# Patient Record
Sex: Female | Born: 1970
Health system: Southern US, Community
[De-identification: ages and names within clinical notes are randomized; demographics above are authoritative.]

## PROBLEM LIST (undated history)

## (undated) DIAGNOSIS — T7840XA Allergy, unspecified, initial encounter: Secondary | ICD-10-CM

## (undated) DIAGNOSIS — R55 Syncope and collapse: Secondary | ICD-10-CM

## (undated) DIAGNOSIS — F32A Depression, unspecified: Secondary | ICD-10-CM

## (undated) DIAGNOSIS — M1990 Unspecified osteoarthritis, unspecified site: Secondary | ICD-10-CM

## (undated) DIAGNOSIS — I10 Essential (primary) hypertension: Secondary | ICD-10-CM

## (undated) DIAGNOSIS — F329 Major depressive disorder, single episode, unspecified: Secondary | ICD-10-CM

## (undated) DIAGNOSIS — D649 Anemia, unspecified: Secondary | ICD-10-CM

## (undated) DIAGNOSIS — K5792 Diverticulitis of intestine, part unspecified, without perforation or abscess without bleeding: Secondary | ICD-10-CM

## (undated) DIAGNOSIS — F419 Anxiety disorder, unspecified: Secondary | ICD-10-CM

## (undated) DIAGNOSIS — K5790 Diverticulosis of intestine, part unspecified, without perforation or abscess without bleeding: Secondary | ICD-10-CM

## (undated) DIAGNOSIS — K219 Gastro-esophageal reflux disease without esophagitis: Secondary | ICD-10-CM

## (undated) DIAGNOSIS — I341 Nonrheumatic mitral (valve) prolapse: Secondary | ICD-10-CM

## (undated) DIAGNOSIS — E079 Disorder of thyroid, unspecified: Secondary | ICD-10-CM

## (undated) HISTORY — DX: Allergy, unspecified, initial encounter: T78.40XA

## (undated) HISTORY — DX: Diverticulitis of intestine, part unspecified, without perforation or abscess without bleeding: K57.92

## (undated) HISTORY — PX: ENDOMETRIAL ABLATION: SHX621

## (undated) HISTORY — PX: TONSILLECTOMY: SHX5217

## (undated) HISTORY — DX: Depression, unspecified: F32.A

## (undated) HISTORY — DX: Anemia, unspecified: D64.9

## (undated) HISTORY — DX: Unspecified osteoarthritis, unspecified site: M19.90

## (undated) HISTORY — DX: Anxiety disorder, unspecified: F41.9

## (undated) HISTORY — DX: Essential (primary) hypertension: I10

## (undated) HISTORY — DX: Major depressive disorder, single episode, unspecified: F32.9

## (undated) HISTORY — DX: Diverticulosis of intestine, part unspecified, without perforation or abscess without bleeding: K57.90

## (undated) HISTORY — DX: Gastro-esophageal reflux disease without esophagitis: K21.9

## (undated) HISTORY — DX: Disorder of thyroid, unspecified: E07.9

---

## 1998-01-01 HISTORY — PX: INDUCED ABORTION: SHX677

## 2000-10-07 ENCOUNTER — Other Ambulatory Visit: Admission: RE | Admit: 2000-10-07 | Discharge: 2000-10-07 | Payer: Self-pay | Admitting: Family Medicine

## 2003-08-03 ENCOUNTER — Encounter: Admission: RE | Admit: 2003-08-03 | Discharge: 2003-08-03 | Payer: Self-pay

## 2003-08-30 ENCOUNTER — Other Ambulatory Visit: Admission: RE | Admit: 2003-08-30 | Discharge: 2003-08-30 | Payer: Self-pay | Admitting: Family Medicine

## 2010-09-20 ENCOUNTER — Other Ambulatory Visit (HOSPITAL_COMMUNITY): Payer: Self-pay | Admitting: Family Medicine

## 2010-09-20 DIAGNOSIS — R1032 Left lower quadrant pain: Secondary | ICD-10-CM

## 2010-09-25 ENCOUNTER — Ambulatory Visit (HOSPITAL_COMMUNITY)
Admission: RE | Admit: 2010-09-25 | Discharge: 2010-09-25 | Disposition: A | Discharge status: Home / Self Care | Payer: Self-pay | Source: Ambulatory Visit | Attending: Family Medicine | Admitting: Family Medicine

## 2010-09-25 DIAGNOSIS — R1032 Left lower quadrant pain: Secondary | ICD-10-CM | POA: Insufficient documentation

## 2012-11-17 ENCOUNTER — Encounter (INDEPENDENT_AMBULATORY_CARE_PROVIDER_SITE_OTHER): Payer: Self-pay

## 2012-11-17 ENCOUNTER — Encounter: Payer: Self-pay | Admitting: General Practice

## 2012-11-17 ENCOUNTER — Ambulatory Visit (INDEPENDENT_AMBULATORY_CARE_PROVIDER_SITE_OTHER): Payer: BC Managed Care – PPO | Admitting: General Practice

## 2012-11-17 VITALS — BP 141/82 | HR 81 | Temp 99.5°F | Ht 62.0 in | Wt 139.5 lb

## 2012-11-17 DIAGNOSIS — Z833 Family history of diabetes mellitus: Secondary | ICD-10-CM

## 2012-11-17 DIAGNOSIS — Z01419 Encounter for gynecological examination (general) (routine) without abnormal findings: Secondary | ICD-10-CM

## 2012-11-17 DIAGNOSIS — Z0000 Encounter for general adult medical examination without abnormal findings: Secondary | ICD-10-CM

## 2012-11-17 LAB — POCT CBC
Granulocyte percent: 64.2 %G (ref 37–80)
Lymph, poc: 2.7 (ref 0.6–3.4)
MCH, POC: 27.7 pg (ref 27–31.2)
MCHC: 32.3 g/dL (ref 31.8–35.4)
MPV: 7.3 fL (ref 0–99.8)
RDW, POC: 12.8 %
WBC: 8.6 10*3/uL (ref 4.6–10.2)

## 2012-11-17 NOTE — Progress Notes (Signed)
  Subjective:    Patient ID: Sandra Powell, female    DOB: [birth year], 42 y.o.   MRN: [id number]  HPI Patient presents today for annual exam with pap. She reports having a history of back pain, due to fracture of buttocks bone. Reports swelling in that area since fracture occurred 16 years ago. Reports being seen by a ortho specialist, when accident occurred. Reports being seen by a chiropractor in [place] for 2 years and treatment was effective, but too expensive. She reports taking OTC medications at times and receives moderate relief.     Review of Systems  Constitutional: Negative for fever and chills.  Respiratory: Negative for chest tightness, shortness of breath and wheezing.   Cardiovascular: Negative for chest pain and palpitations.  Gastrointestinal: Negative for nausea, vomiting, abdominal pain, diarrhea, constipation and blood in stool.  Genitourinary: Negative for difficulty urinating.  Musculoskeletal: Positive for back pain.       Low back pain  Neurological: Negative for dizziness, weakness and headaches.       Objective:   Physical Exam  Constitutional: She is oriented to person, place, and time. She appears well-developed and well-nourished.  HENT:  Head: Normocephalic and atraumatic.  Right Ear: External ear normal.  Left Ear: External ear normal.  Mouth/Throat: Oropharynx is clear and moist.  Eyes: Conjunctivae and EOM are normal. Pupils are equal, round, and reactive to light.  Neck: Normal range of motion. Neck supple. No thyromegaly present.  Cardiovascular: Normal rate, regular rhythm, normal heart sounds and intact distal pulses.   Pulmonary/Chest: Effort normal and breath sounds normal. No respiratory distress. Right breast exhibits no inverted nipple, no mass, no nipple discharge, no skin change and no tenderness. Left breast exhibits no inverted nipple, no mass, no nipple discharge, no skin change and no tenderness. Breasts are symmetrical.  Abdominal: Soft.  Bowel sounds are normal. She exhibits no distension.  Genitourinary: Vagina normal and uterus normal. No breast swelling, tenderness, discharge or bleeding. No labial fusion. There is no rash, tenderness, lesion or injury on the right labia. There is no rash, tenderness, lesion or injury on the left labia. Uterus is not deviated, not enlarged, not fixed and not tender. Cervix exhibits no motion tenderness, no discharge and no friability. Right adnexum displays no mass, no tenderness and no fullness. Left adnexum displays no mass, no tenderness and no fullness. No erythema, tenderness or bleeding around the vagina. No foreign body around the vagina. No signs of injury around the vagina. No vaginal discharge found.  Musculoskeletal: She exhibits edema and tenderness.  Tenderness and edema noted to sacral area, upon palpation  Lymphadenopathy:    She has no cervical adenopathy.  Neurological: She is alert and oriented to person, place, and time.  Skin: Skin is warm and dry.  Psychiatric: She has a normal mood and affect.          Assessment & Plan:  1. Annual physical exam  - Pap IG w/ reflex to HPV when ASC-U - POCT CBC - CMP14+EGFR - NMR, lipoprofile  2. Family history of diabetes mellitus  - POCT glycosylated hemoglobin (Hb A1C) -discussed healthy eating -RTO if symptoms develop or worsen -Patient verbalized understanding -Coralie Keens, FNP-C

## 2012-11-17 NOTE — Patient Instructions (Signed)

## 2012-11-19 LAB — NMR, LIPOPROFILE
HDL Cholesterol by NMR: 40 mg/dL (ref >=40)
HDL Particle Number: 29.2 umol/L — ABNORMAL LOW (ref >=30.5)
LDL Particle Number: 1647 nmol/L — ABNORMAL HIGH (ref <1000)
LDLC SERPL CALC-MCNC: 111 mg/dL — ABNORMAL HIGH (ref <100)
Small LDL Particle Number: 877 nmol/L — ABNORMAL HIGH (ref <=527)
Triglycerides by NMR: 128 mg/dL (ref <150)

## 2012-11-19 LAB — CMP14+EGFR
AST: 13 IU/L (ref 0–40)
Albumin/Globulin Ratio: 1.9 (ref 1.1–2.5)
BUN: 10 mg/dL (ref 6–24)
Glucose: 78 mg/dL (ref 65–99)
Potassium: 4.8 mmol/L (ref 3.5–5.2)
Sodium: 139 mmol/L (ref 134–144)
Total Bilirubin: 0.4 mg/dL (ref 0.0–1.2)

## 2012-11-21 ENCOUNTER — Other Ambulatory Visit: Payer: Self-pay | Admitting: General Practice

## 2012-11-21 LAB — PAP IG W/ RFLX HPV ASCU: PAP Smear Comment: 0

## 2012-11-26 ENCOUNTER — Telehealth: Payer: Self-pay | Admitting: General Practice

## 2012-11-26 NOTE — Telephone Encounter (Signed)
Number not working for patient

## 2012-12-12 NOTE — Telephone Encounter (Signed)
Pt aware, cholesterol elevated,needs to work with diet.

## 2013-04-28 LAB — PULMONARY FUNCTION TEST

## 2013-06-08 ENCOUNTER — Emergency Department (HOSPITAL_COMMUNITY): Payer: BC Managed Care – PPO

## 2013-06-08 ENCOUNTER — Encounter (HOSPITAL_COMMUNITY): Payer: Self-pay | Admitting: Emergency Medicine

## 2013-06-08 ENCOUNTER — Emergency Department (HOSPITAL_COMMUNITY)
Admission: EM | Admit: 2013-06-08 | Discharge: 2013-06-08 | Disposition: A | Discharge status: Home / Self Care | Payer: BC Managed Care – PPO | Attending: Emergency Medicine | Admitting: Emergency Medicine

## 2013-06-08 DIAGNOSIS — R110 Nausea: Secondary | ICD-10-CM | POA: Insufficient documentation

## 2013-06-08 DIAGNOSIS — Z8679 Personal history of other diseases of the circulatory system: Secondary | ICD-10-CM | POA: Insufficient documentation

## 2013-06-08 DIAGNOSIS — R1032 Left lower quadrant pain: Secondary | ICD-10-CM | POA: Insufficient documentation

## 2013-06-08 DIAGNOSIS — R634 Abnormal weight loss: Secondary | ICD-10-CM | POA: Insufficient documentation

## 2013-06-08 DIAGNOSIS — M7981 Nontraumatic hematoma of soft tissue: Secondary | ICD-10-CM | POA: Insufficient documentation

## 2013-06-08 DIAGNOSIS — Z3202 Encounter for pregnancy test, result negative: Secondary | ICD-10-CM | POA: Insufficient documentation

## 2013-06-08 DIAGNOSIS — R109 Unspecified abdominal pain: Secondary | ICD-10-CM

## 2013-06-08 DIAGNOSIS — R55 Syncope and collapse: Secondary | ICD-10-CM | POA: Insufficient documentation

## 2013-06-08 HISTORY — DX: Syncope and collapse: R55

## 2013-06-08 HISTORY — DX: Nonrheumatic mitral (valve) prolapse: I34.1

## 2013-06-08 LAB — POC URINE PREG, ED: PREG TEST UR: NEGATIVE

## 2013-06-08 LAB — COMPREHENSIVE METABOLIC PANEL
ALK PHOS: 47 U/L (ref 39–117)
ALT: 12 U/L (ref 0–35)
AST: 14 U/L (ref 0–37)
Albumin: 3.9 g/dL (ref 3.5–5.2)
BILIRUBIN TOTAL: 0.2 mg/dL — AB (ref 0.3–1.2)
BUN: 10 mg/dL (ref 6–23)
CO2: 27 mEq/L (ref 19–32)
Calcium: 9.1 mg/dL (ref 8.4–10.5)
Chloride: 105 mEq/L (ref 96–112)
Creatinine, Ser: 0.63 mg/dL (ref 0.50–1.10)
Glucose, Bld: 108 mg/dL — ABNORMAL HIGH (ref 70–99)
POTASSIUM: 4.6 meq/L (ref 3.7–5.3)
SODIUM: 142 meq/L (ref 137–147)
TOTAL PROTEIN: 6.7 g/dL (ref 6.0–8.3)

## 2013-06-08 LAB — CBC WITH DIFFERENTIAL/PLATELET
BASOS ABS: 0 10*3/uL (ref 0.0–0.1)
BASOS PCT: 0 % (ref 0–1)
EOS ABS: 0.2 10*3/uL (ref 0.0–0.7)
Eosinophils Relative: 2 % (ref 0–5)
HEMATOCRIT: 34.9 % — AB (ref 36.0–46.0)
Hemoglobin: 11.3 g/dL — ABNORMAL LOW (ref 12.0–15.0)
Lymphocytes Relative: 32 % (ref 12–46)
Lymphs Abs: 2.4 10*3/uL (ref 0.7–4.0)
MCH: 28.4 pg (ref 26.0–34.0)
MCHC: 32.4 g/dL (ref 30.0–36.0)
MCV: 87.7 fL (ref 78.0–100.0)
MONOS PCT: 11 % (ref 3–12)
Monocytes Absolute: 0.8 10*3/uL (ref 0.1–1.0)
NEUTROS PCT: 55 % (ref 43–77)
Neutro Abs: 4.1 10*3/uL (ref 1.7–7.7)
PLATELETS: 246 10*3/uL (ref 150–400)
RBC: 3.98 MIL/uL (ref 3.87–5.11)
RDW: 13.7 % (ref 11.5–15.5)
WBC: 7.5 10*3/uL (ref 4.0–10.5)

## 2013-06-08 LAB — URINALYSIS, ROUTINE W REFLEX MICROSCOPIC
Bilirubin Urine: NEGATIVE
Glucose, UA: NEGATIVE mg/dL
KETONES UR: NEGATIVE mg/dL
Leukocytes, UA: NEGATIVE
Nitrite: NEGATIVE
PROTEIN: NEGATIVE mg/dL
SPECIFIC GRAVITY, URINE: 1.015 (ref 1.005–1.030)
Urobilinogen, UA: 0.2 mg/dL (ref 0.0–1.0)
pH: 7.5 (ref 5.0–8.0)

## 2013-06-08 LAB — LIPASE, BLOOD: LIPASE: 38 U/L (ref 11–59)

## 2013-06-08 LAB — URINE MICROSCOPIC-ADD ON

## 2013-06-08 MED ORDER — IOHEXOL 300 MG/ML  SOLN
50.0000 mL | Freq: Once | INTRAMUSCULAR | Status: AC | PRN
  Administered 2013-06-08: 50 mL via ORAL

## 2013-06-08 MED ORDER — OMEPRAZOLE 20 MG PO CPDR: 20.0000 mg | DELAYED_RELEASE_CAPSULE | Freq: Every day | ORAL | Status: DC

## 2013-06-08 MED ORDER — IOHEXOL 300 MG/ML  SOLN
100.0000 mL | Freq: Once | INTRAMUSCULAR | Status: AC | PRN
  Administered 2013-06-08: 100 mL via INTRAVENOUS

## 2013-06-08 MED ORDER — GI COCKTAIL ~~LOC~~
30.0000 mL | Freq: Once | ORAL | Status: AC
  Administered 2013-06-08: 30 mL via ORAL
  Filled 2013-06-08: qty 30

## 2013-06-08 NOTE — ED Notes (Addendum)
sycopal episode on Saturday, went to Urgent care today and told to come to ER  .  Dx with MVP recently.  Has had syncopal episodes 6 times in past years.  No known cause.  No pain, Nausea without vomiting.    Epigastric discomfort and pain rt scapula.

## 2013-06-08 NOTE — Discharge Instructions (Signed)
As discussed, your evaluation today has been largely reassuring.  But, it is important that you monitor your condition carefully, and do not hesitate to return to the ED if you develop new, or concerning changes in your condition. ° °Otherwise, please follow-up with your physicians for appropriate ongoing care. ° °

## 2013-06-08 NOTE — ED Provider Notes (Addendum)
CSN: [id number]     Arrival date & time 06/08/13  1319 History   First MD Initiated Contact with Patient 06/08/13 1352     Chief Complaint  Patient presents with  . Loss of Consciousness   Patient is a 43 y.o. female presenting with syncope. The history is provided by the patient. No language interpreter was used.  Loss of Consciousness Associated symptoms: nausea   Associated symptoms: no vomiting    This chart was scribed for Sandra Muskrat, MD by Thea Alken, ED Scribe. This patient was seen in room APA02/APA02 and the patient's care was started at 1:53 PM.  HPI Comments:  Sandra Powell is a 43 y.o. female who present to the Emergency Department complaining of LOC x 2 days ago.Pt was seen at urgent care today for intermittent sharp abdominal pain x 2 months that worsens at last night. At this time she reports abdominal discomfort. She states she has nausea when eating. Pt is concerned that the abdominal pain is the is related to LOC. Pt noticed today bruising to posterior head and right lateral thigh. Pt has been diagnosed with mitral valve prolapse.  Pt reports variable menstrual cycles, last tow months ago. She denies prior frank rectal bleeding.   Past Medical History  Diagnosis Date  . MVP (mitral valve prolapse)   . Syncope    Past Surgical History  Procedure Laterality Date  . Tonsillectomy    . Tonsillectomy     History reviewed. No pertinent family history. History  Substance Use Topics  . Smoking status: Never Smoker   . Smokeless tobacco: Not on file  . Alcohol Use: No   OB History   Grav Para Term Preterm Abortions TAB SAB Ect Mult Living                 Review of Systems  Constitutional: Positive for unexpected weight change.  HENT:       Per HPI, otherwise negative  Respiratory:       Per HPI, otherwise negative  Cardiovascular: Positive for syncope.       Per HPI, otherwise negative  Gastrointestinal: Positive for nausea. Negative for vomiting.   Endocrine:       Negative aside from HPI  Genitourinary:       Neg aside from HPI   Musculoskeletal:       Per HPI, otherwise negative  Skin: Negative for pallor.  Neurological: Negative for syncope.    Allergies  Review of patient's allergies indicates no known allergies.  Home Medications   Prior to Admission medications   Not on File   BP 146/88  Pulse 71  Temp(Src) 98.5 F (36.9 C)  Resp 20  Wt 139 lb (63.05 kg)  SpO2 99%  LMP 05/25/2013 Physical Exam  Nursing note and vitals reviewed. Constitutional: She is oriented to person, place, and time. She appears well-developed and well-nourished. No distress.  HENT:  Head: Normocephalic and atraumatic.  Eyes: Conjunctivae and EOM are normal.  Cardiovascular: Normal rate and regular rhythm.   Pulmonary/Chest: Effort normal and breath sounds normal. No stridor. No respiratory distress.  Abdominal: She exhibits no distension. There is tenderness in the left lower quadrant.  Musculoskeletal: She exhibits no edema.  Neurological: She is alert and oriented to person, place, and time. No cranial nerve deficit.  Skin: Skin is warm and dry.  Superficial hematoma of R lateral, mid thigh.  Psychiatric: She has a normal mood and affect.    ED Course  Procedures  Labs Review Labs Reviewed  CBC WITH DIFFERENTIAL - Abnormal; Notable for the following:    Hemoglobin 11.3 (*)    HCT 34.9 (*)    All other components within normal limits  COMPREHENSIVE METABOLIC PANEL - Abnormal; Notable for the following:    Glucose, Bld 108 (*)    Total Bilirubin 0.2 (*)    All other components within normal limits  URINALYSIS, ROUTINE W REFLEX MICROSCOPIC - Abnormal; Notable for the following:    Hgb urine dipstick TRACE (*)    All other components within normal limits  URINE MICROSCOPIC-ADD ON - Abnormal; Notable for the following:    Squamous Epithelial / LPF MANY (*)    All other components within normal limits  LIPASE, BLOOD  POC  URINE PREG, ED     EKG Interpretation   Date/Time:  Monday June 08 2013 13:24:59 EDT Ventricular Rate:  69 PR Interval:  158 QRS Duration: 86 QT Interval:  392 QTC Calculation: 420 R Axis:   86 Text Interpretation:  Normal sinus rhythm Normal ECG Sinus rhythm Normal  ECG Confirmed by Sandra Muskrat  MD ([id number]) on 06/08/2013 2:30:28 PM      After the initial evaluation I reviewed the patient's note from urgent care, including EKG, which is similar to today's study here. Labs suggest anemia, hematuria.  4:07 PM Already exam the patient appears calm.  She is sitting upright, using her cellular telephone, talking with a colleague. We discussed all lab results.  Patient states that she has previously worn a Holter monitor, had echocardiograms in the past 2 months, has a cardiologist in [place] as well as upcoming primary care visit.  She states that she was particularly worried about her gallbladder, as it may"explode". After discussing all results, patient was provided return precautions, follow up instructions explicitly.  MDM    I personally performed the services described in this documentation, which was scribed in my presence. The recorded information has been reviewed and is accurate.  Patient presents with ongoing abdominal pain, none of which is right sided, but there is low suspicion for occult biliary process, or appendicitis. Patient's secondary concern is a syncopal event that occurred 2 days ago.  The patient's evaluation here does not demonstrate dysrhythmia, significantly abnormality. Patient has had prior evaluation, including cardiac monitoring with no firm diagnosis for her syncope. Patient does have a cardiologist and she'll followup in regards to this. Throughout the patient's emergency department course she was hemodynamically stable, neurologically intact, awake, alert, with no evidence of distress. New the etiology for her syncopal event is not clearly  demonstrated, she is appropriate for further evaluation and management as an outpatient  Sandra Muskrat, MD 06/08/13 1612  4:33 PM Just prior to discharge was called back to the patient's room after she had an episode of severe left lower quadrant abdominal pain.  On patient clarifies that she has had several similar episodes in the past days. With his new prescription, patient will have CT scan performed.  6:31 PM No new complaints.  Patient is hemodynamically stable.  CT scan is unremarkable aside from right-sided corpus luteal cyst, which the patient can't discuss with her primary care physician. With no additional complaints, no evidence of decompensation, she was discharged in stable condition.  Sandra Muskrat, MD 06/08/13 [phone]

## 2013-06-08 NOTE — ED Notes (Signed)
Pt alert & oriented x4, stable gait. Patient given discharge instructions, paperwork & prescription(s). Patient  instructed to stop at the registration desk to finish any additional paperwork. Patient verbalized understanding. Pt left department w/ no further questions. 

## 2013-06-12 ENCOUNTER — Other Ambulatory Visit: Payer: Self-pay | Admitting: *Deleted

## 2013-06-12 DIAGNOSIS — R1032 Left lower quadrant pain: Secondary | ICD-10-CM

## 2013-06-12 DIAGNOSIS — R55 Syncope and collapse: Secondary | ICD-10-CM

## 2013-06-12 DIAGNOSIS — R002 Palpitations: Secondary | ICD-10-CM

## 2013-06-12 DIAGNOSIS — I341 Nonrheumatic mitral (valve) prolapse: Secondary | ICD-10-CM

## 2013-06-16 ENCOUNTER — Other Ambulatory Visit: Payer: Self-pay | Admitting: *Deleted

## 2013-06-16 ENCOUNTER — Encounter: Payer: Self-pay | Admitting: Internal Medicine

## 2013-07-22 ENCOUNTER — Ambulatory Visit (INDEPENDENT_AMBULATORY_CARE_PROVIDER_SITE_OTHER): Payer: BC Managed Care – PPO | Admitting: Family

## 2013-07-22 ENCOUNTER — Ambulatory Visit (INDEPENDENT_AMBULATORY_CARE_PROVIDER_SITE_OTHER): Payer: BC Managed Care – PPO

## 2013-07-22 ENCOUNTER — Ambulatory Visit (HOSPITAL_COMMUNITY)
Admission: RE | Admit: 2013-07-22 | Discharge: 2013-07-22 | Disposition: A | Discharge status: Home / Self Care | Payer: BC Managed Care – PPO | Source: Ambulatory Visit | Attending: Family | Admitting: Family

## 2013-07-22 ENCOUNTER — Encounter: Payer: Self-pay | Admitting: Family

## 2013-07-22 VITALS — BP 142/91 | HR 73 | Temp 98.6°F | Ht 62.0 in | Wt 129.4 lb

## 2013-07-22 DIAGNOSIS — M542 Cervicalgia: Secondary | ICD-10-CM

## 2013-07-22 DIAGNOSIS — G44319 Acute post-traumatic headache, not intractable: Secondary | ICD-10-CM

## 2013-07-22 DIAGNOSIS — G44309 Post-traumatic headache, unspecified, not intractable: Secondary | ICD-10-CM | POA: Insufficient documentation

## 2013-07-22 DIAGNOSIS — IMO0001 Reserved for inherently not codable concepts without codable children: Secondary | ICD-10-CM | POA: Insufficient documentation

## 2013-07-22 DIAGNOSIS — M549 Dorsalgia, unspecified: Secondary | ICD-10-CM

## 2013-07-22 DIAGNOSIS — W19XXXS Unspecified fall, sequela: Secondary | ICD-10-CM | POA: Insufficient documentation

## 2013-07-22 DIAGNOSIS — M546 Pain in thoracic spine: Secondary | ICD-10-CM

## 2013-07-22 LAB — POCT CBC
Granulocyte percent: 68.7 %G (ref 37–80)
HCT, POC: 37.7 % (ref 37.7–47.9)
Hemoglobin: 12 g/dL — AB (ref 12.2–16.2)
LYMPH, POC: 2.1 (ref 0.6–3.4)
MCH, POC: 26.7 pg — AB (ref 27–31.2)
MCHC: 31.7 g/dL — AB (ref 31.8–35.4)
MCV: 84.1 fL (ref 80–97)
MPV: 7.2 fL (ref 0–99.8)
POC GRANULOCYTE: 5.1 (ref 2–6.9)
POC LYMPH %: 28.4 % (ref 10–50)
Platelet Count, POC: 281 10*3/uL (ref 142–424)
RBC: 4.5 M/uL (ref 4.04–5.48)
RDW, POC: 13.7 %
WBC: 7.4 10*3/uL (ref 4.6–10.2)

## 2013-07-22 MED ORDER — CYCLOBENZAPRINE HCL 5 MG PO TABS: 5.0000 mg | ORAL_TABLET | Freq: Three times a day (TID) | ORAL | Status: DC | PRN

## 2013-07-22 NOTE — Addendum Note (Signed)
Addended by: Jamelle Haring on: 07/22/2013 10:00 AM   Modules accepted: Orders

## 2013-07-22 NOTE — Patient Instructions (Signed)
Spasticity Spasticity is a condition in which certain muscles contract continuously. This causes stiffness or tightness of the muscles. It may interfere with movement, speech, and manner of walking. CAUSES  This condition is usually caused by damage to the portion of the brain or spinal cord that controls voluntary movement. It may occur in association with: Spinal cord injury. Multiple sclerosis. Cerebral palsy. Brain damage due to lack of oxygen. Brain trauma. Severe head injury. Metabolic diseases such as: Adrenoleukodystrophy. ALS [place] Cerise Gehrig's disease). Phenylketonuria. SYMPTOMS  Increased muscle tone (hypertonicity). A series of rapid muscle contractions (clonus). Exaggerated deep tendon reflexes. Muscle spasms. Involuntary crossing of the legs (scissoring). Fixed joints. The degree of spasticity varies. It ranges from mild muscle stiffness to severe, painful, and uncontrollable muscle spasms. It can interfere with rehabilitation in patients with certain disorders. It often interferes with daily activities. TREATMENT  Treatment may include: Medications. Physical therapy regimens. They may include muscle stretching and range of motion exercises. These help prevent shrinkage or shortening of muscles. They also help reduce the severity of symptoms. Surgery. This may be recommended for tendon release or to sever the nerve-muscle pathway. PROGNOSIS  The outcome for those with spasticity depends on: Severity of the spasticity. Associated disorder(s). Document Released: 12/08/2001 Document Revised: 03/12/2011 Document Reviewed: 12/18/2004 [facility] Patient Information 2015 [place], [place]. This information is not intended to replace advice given to you by your health care provider. Make sure you discuss any questions you have with your health care provider. Muscle Cramps and Spasms Muscle cramps and spasms occur when a muscle or muscles tighten and you have no control over this  tightening (involuntary muscle contraction). They are a common problem and can develop in any muscle. The most common place is in the calf muscles of the leg. Both muscle cramps and muscle spasms are involuntary muscle contractions, but they also have differences:   Muscle cramps are sporadic and painful. They may last a few seconds to a quarter of an hour. Muscle cramps are often more forceful and last longer than muscle spasms.  Muscle spasms may or may not be painful. They may also last just a few seconds or much longer. CAUSES  It is uncommon for cramps or spasms to be due to a serious underlying problem. In many cases, the cause of cramps or spasms is unknown. Some common causes are:   Overexertion.   Overuse from repetitive motions (doing the same thing over and over).   Remaining in a certain position for a long period of time.   Improper preparation, form, or technique while performing a sport or activity.   Dehydration.   Injury.   Side effects of some medicines.   Abnormally low levels of the salts and ions in your blood (electrolytes), especially potassium and calcium. This could happen if you are taking water pills (diuretics) or you are pregnant.  Some underlying medical problems can make it more likely to develop cramps or spasms. These include, but are not limited to:   Diabetes.   Parkinson disease.   Hormone disorders, such as thyroid problems.   Alcohol abuse.   Diseases specific to muscles, joints, and bones.   Blood vessel disease where not enough blood is getting to the muscles.  HOME CARE INSTRUCTIONS   Stay well hydrated. Drink enough water and fluids to keep your urine clear or pale yellow.  It may be helpful to massage, stretch, and relax the affected muscle.  For tight or tense muscles, use a warm  towel, heating pad, or hot shower water directed to the affected area.  If you are sore or have pain after a cramp or spasm, applying ice  to the affected area may relieve discomfort.  Put ice in a plastic bag.  Place a towel between your skin and the bag.  Leave the ice on for 15-20 minutes, 03-04 times a day.  Medicines used to treat a known cause of cramps or spasms may help reduce their frequency or severity. Only take over-the-counter or prescription medicines as directed by your caregiver. SEEK MEDICAL CARE IF:  Your cramps or spasms get more severe, more frequent, or do not improve over time.  MAKE SURE YOU:   Understand these instructions.  Will watch your condition.  Will get help right away if you are not doing well or get worse. Document Released: 06/09/2001 Document Revised: 04/14/2012 Document Reviewed: 12/05/2011 [facility] Patient Information 2015 [place], [place]. This information is not intended to replace advice given to you by your health care provider. Make sure you discuss any questions you have with your health care provider. RICE: Routine Care for Injuries The routine care of many injuries includes Rest, Ice, Compression, and Elevation (RICE). HOME CARE INSTRUCTIONS  Rest is needed to allow your body to heal. Routine activities can usually be resumed when comfortable. Injured tendons and bones can take up to 6 weeks to heal. Tendons are the cord-like structures that attach muscle to bone.  Ice following an injury helps keep the swelling down and reduces pain.  Put ice in a plastic bag.  Place a towel between your skin and the bag.  Leave the ice on for 15-20 minutes, 3-4 times a day, or as directed by your health care provider. Do this while awake, for the first 24 to 48 hours. After that, continue as directed by your caregiver.  Compression helps keep swelling down. It also gives support and helps with discomfort. If an elastic bandage has been applied, it should be removed and reapplied every 3 to 4 hours. It should not be applied tightly, but firmly enough to keep swelling down. Watch fingers or  toes for swelling, bluish discoloration, coldness, numbness, or excessive pain. If any of these problems occur, remove the bandage and reapply loosely. Contact your caregiver if these problems continue.  Elevation helps reduce swelling and decreases pain. With extremities, such as the arms, hands, legs, and feet, the injured area should be placed near or above the level of the heart, if possible. SEEK IMMEDIATE MEDICAL CARE IF:  You have persistent pain and swelling.  You develop redness, numbness, or unexpected weakness.  Your symptoms are getting worse rather than improving after several days. These symptoms may indicate that further evaluation or further X-rays are needed. Sometimes, X-rays may not show a small broken bone (fracture) until 1 week or 10 days later. Make a follow-up appointment with your caregiver. Ask when your X-ray results will be ready. Make sure you get your X-ray results. Document Released: 04/01/2000 Document Revised: 12/23/2012 Document Reviewed: 05/19/2010 [facility] Patient Information 2015 [place], [place]. This information is not intended to replace advice given to you by your health care provider. Make sure you discuss any questions you have with your health care provider.

## 2013-07-22 NOTE — Progress Notes (Signed)
Subjective:    Patient ID: Sandra Powell, female    DOB: 1970/07/29, 43 y.o.   MRN: [id number]  Back Pain This is a new problem. The current episode started 1 to 4 weeks ago (July 4). The problem occurs intermittently. The problem has been gradually worsening since onset. The pain is present in the lumbar spine, thoracic spine and gluteal. The quality of the pain is described as aching, shooting and stabbing. Radiates to: bilateral arms, and hips. The pain is at a severity of 8/10. The pain is moderate. The pain is the same all the time. The symptoms are aggravated by twisting and position. Associated symptoms include headaches, leg pain and tingling. Pertinent negatives include no abdominal pain, bladder incontinence, bowel incontinence, chest pain or numbness. Risk factors include recent trauma. She has tried NSAIDs and ice for the symptoms. The treatment provided mild relief.   *Pt was hanging upside down and fell on head on concrete on July 4. Pt states her sysptoms are getting worse. States she is having a constant headache that moves from left side to right side to frontal lobe.      Review of Systems  Constitutional: Negative.   Eyes: Negative.   Respiratory: Negative.  Negative for shortness of breath.   Cardiovascular: Negative.  Negative for chest pain and palpitations.  Gastrointestinal: Negative.  Negative for abdominal pain and bowel incontinence.  Endocrine: Negative.   Genitourinary: Negative.  Negative for bladder incontinence.  Musculoskeletal: Positive for back pain.  Neurological: Positive for tingling and headaches. Negative for numbness.  Hematological: Negative.   Psychiatric/Behavioral: Negative.   All other systems reviewed and are negative.      Objective:   Physical Exam  Vitals reviewed. Constitutional: She is oriented to person, place, and time. She appears well-developed and well-nourished. No distress.  HENT:  Head: Normocephalic and atraumatic.  Right  Ear: External ear normal.  Left Ear: External ear normal.  Mouth/Throat: Oropharynx is clear and moist.  Eyes: Pupils are equal, round, and reactive to light.  Neck: Normal range of motion. Neck supple. No thyromegaly present.  Cardiovascular: Normal rate, regular rhythm, normal heart sounds and intact distal pulses.   No murmur heard. Pulmonary/Chest: Effort normal and breath sounds normal. No respiratory distress. She has no wheezes.  Abdominal: Soft. Bowel sounds are normal. She exhibits no distension. There is no tenderness.  Musculoskeletal: She exhibits edema and tenderness.  Decreased ROM of neck and lower back r/t to pain  Neurological: She is alert and oriented to person, place, and time. She has normal reflexes. No cranial nerve deficit.  Skin: Skin is warm and dry.  Psychiatric: She has a normal mood and affect. Her behavior is normal. Judgment and thought content normal.    BP 142/91  Pulse 73  Temp(Src) 98.6 F (37 C) (Oral)  Ht 5' 2"  (1.575 m)  Wt 129 lb 6.4 oz (58.695 kg)  BMI 23.66 kg/m2  LMP 07/12/2013       Assessment & Plan:  1. Neck pain - DG Thoracic Spine 2 View; Future - DG Lumbar Spine 2-3 Views; Future - DG Cervical Spine Complete; Future - cyclobenzaprine (FLEXERIL) 5 MG tablet; Take 1 tablet (5 mg total) by mouth 3 (three) times daily as needed for muscle spasms.  Dispense: 60 tablet; Refill: 1 - POCT CBC - CMP14+EGFR  2. Midline thoracic back pain - cyclobenzaprine (FLEXERIL) 5 MG tablet; Take 1 tablet (5 mg total) by mouth 3 (three) times daily as needed for  muscle spasms.  Dispense: 60 tablet; Refill: 1 - POCT CBC - CMP14+EGFR  3. Acute post-traumatic headache, not intractable - CT Head W Contrast; Future STAT - POCT CBC - CMP14+EGFR  RTO in 2 weeks No NSAIDS until Ct scan comes back Discussed flexeril may cause drowsiness  Evelina Dun, FNP

## 2013-07-23 LAB — CMP14+EGFR
ALT: 15 IU/L (ref 0–32)
AST: 15 IU/L (ref 0–40)
Albumin/Globulin Ratio: 1.8 (ref 1.1–2.5)
Albumin: 4.7 g/dL (ref 3.5–5.5)
Alkaline Phosphatase: 62 IU/L (ref 39–117)
BILIRUBIN TOTAL: 0.3 mg/dL (ref 0.0–1.2)
BUN/Creatinine Ratio: 15 (ref 9–23)
BUN: 14 mg/dL (ref 6–24)
CO2: 26 mmol/L (ref 18–29)
Calcium: 9.7 mg/dL (ref 8.7–10.2)
Chloride: 101 mmol/L (ref 97–108)
Creatinine, Ser: 0.92 mg/dL (ref 0.57–1.00)
GFR, EST AFRICAN AMERICAN: 88 mL/min/{1.73_m2} (ref >59)
GFR, EST NON AFRICAN AMERICAN: 77 mL/min/{1.73_m2} (ref >59)
GLUCOSE: 80 mg/dL (ref 65–99)
Globulin, Total: 2.6 g/dL (ref 1.5–4.5)
POTASSIUM: 4.3 mmol/L (ref 3.5–5.2)
SODIUM: 141 mmol/L (ref 134–144)
TOTAL PROTEIN: 7.3 g/dL (ref 6.0–8.5)

## 2013-07-27 ENCOUNTER — Telehealth: Payer: Self-pay | Admitting: Family Medicine

## 2013-07-27 NOTE — Telephone Encounter (Signed)
Message copied by Waverly Ferrari on Mon Jul 27, 2013  9:21 AM ------      Message from: Lenna Gilford, [place] A      Created: Mon Jul 27, 2013  9:09 AM       CBC (WBC, HGB, and Plts)-WNL      Kidney and liver function stable       ------

## 2013-08-18 ENCOUNTER — Encounter: Payer: Self-pay | Admitting: *Deleted

## 2013-08-19 ENCOUNTER — Ambulatory Visit (INDEPENDENT_AMBULATORY_CARE_PROVIDER_SITE_OTHER): Payer: BC Managed Care – PPO | Admitting: Cardiology

## 2013-08-19 ENCOUNTER — Encounter: Payer: Self-pay | Admitting: Cardiology

## 2013-08-19 VITALS — BP 133/82 | HR 76 | Ht 62.0 in | Wt 125.0 lb

## 2013-08-19 DIAGNOSIS — I059 Rheumatic mitral valve disease, unspecified: Secondary | ICD-10-CM

## 2013-08-19 DIAGNOSIS — I341 Nonrheumatic mitral (valve) prolapse: Secondary | ICD-10-CM

## 2013-08-19 MED ORDER — ATENOLOL 50 MG PO TABS: 50.0000 mg | ORAL_TABLET | Freq: Every day | ORAL | Status: DC

## 2013-08-19 NOTE — Patient Instructions (Signed)
The current medical regimen is effective;  continue present plan and medications.  Follow up in 1 year with Dr. Hochrein in [place].  You will receive a letter in the mail 2 months before you are due.  Please call us when you receive this letter to schedule your follow up appointment.  

## 2013-08-19 NOTE — Progress Notes (Signed)
HPI The patient presents as a new patient for follow up of MVP. She was recently evaluated by a cardiologist in [place] for evaluation of chest discomfort. She had been having palpitations and with this some discomfort and fluttering in her chest. She has a history of syncope about 8 times over the years the last episode being about 2 months ago.  I don't have any of the outside records. However, she describes having a monitor for one week which apparently demonstrated some palpitations and she reports having a treadmill test, PFTs and echocardiogram. She was subsequently started on atenolol. She says that since that time she's felt much better. She's not having any palpitations. She's had no recurrent syncope or presyncope. She's had no further chest discomfort, neck or arm discomfort. She says that stress and anxiety seem to be less problematic as well.  No Known Allergies  Current Outpatient Prescriptions  Medication Sig Dispense Refill  . atenolol (TENORMIN) 50 MG tablet Take 50 mg by mouth daily.      . cyclobenzaprine (FLEXERIL) 5 MG tablet Take 1 tablet (5 mg total) by mouth 3 (three) times daily as needed for muscle spasms.  60 tablet  1  . naproxen sodium (ANAPROX) 220 MG tablet Take 440 mg by mouth daily as needed (back pain.).       No current facility-administered medications for this visit.    Past Medical History  Diagnosis Date  . MVP (mitral valve prolapse)   . Syncope   . Diverticulosis     Past Surgical History  Procedure Laterality Date  . Tonsillectomy    . Tonsillectomy      No family history on file.  History   Social History  . Marital Status: Single    Spouse Name: N/A    Number of Children: N/A  . Years of Education: N/A   Occupational History  . Not on file.   Social History Main Topics  . Smoking status: Never Smoker   . Smokeless tobacco: Not on file  . Alcohol Use: No  . Drug Use: Yes    Special: Marijuana  . Sexual Activity: Yes   Birth Control/ Protection: None   Other Topics Concern  . Not on file   Social History Narrative  . No narrative on file    ROS: Positive for pains, muscle cramps, headaches, dizziness, nausea, vomiting, constipation. Otherwise as stated in the HPI and negative for all other systems.  PHYSICAL EXAM BP 133/82  Pulse 76  Ht 5\' 3"  (1.6 m)  Wt 125 lb (56.7 kg)  BMI 22.15 kg/m2  LMP 07/09/2013 GENERAL:  Well appearing HEENT:  Pupils equal round and reactive, fundi not visualized, oral mucosa unremarkable NECK:  No jugular venous distention, waveform within normal limits, carotid upstroke brisk and symmetric, no bruits, no thyromegaly LYMPHATICS:  No cervical, inguinal adenopathy LUNGS:  Clear to auscultation bilaterally BACK:  No CVA tenderness CHEST:  Unremarkable HEART:  PMI not displaced or sustained,S1 and S2 within normal limits, no S3, no S4, no clicks, no rubs, no murmurs ABD:  Flat, positive bowel sounds normal in frequency in pitch, no bruits, no rebound, no guarding, no midline pulsatile mass, no hepatomegaly, no splenomegaly EXT:  2 plus pulses throughout, no edema, no cyanosis no clubbing SKIN:  No rashes no nodules NEURO:  Cranial nerves II through XII grossly intact, motor grossly intact throughout PSYCH:  Cognitively intact, oriented to person place and time   EKG:  Sinus rhythm, rate 69,  axis within normal limits, intervals within normal limits, no acute ST-T wave changes  06/08/13  ASSESSMENT AND PLAN  SYNCOPE:  She has had no further episodes of this. The etiology is not clear. At this point I would like to review the outside records but would not suggest a change in therapy and if she has recurrent events.  PALPITATIONS:  He seemed to be controlled with a beta blocker. We'll be happy to refill this prescription.  MVP:  I don't hear any regurgitation. I will ollow this clinically and I will look at the echo result as above.

## 2013-08-20 ENCOUNTER — Ambulatory Visit (INDEPENDENT_AMBULATORY_CARE_PROVIDER_SITE_OTHER): Payer: BC Managed Care – PPO | Admitting: Internal Medicine

## 2013-08-20 ENCOUNTER — Encounter: Payer: Self-pay | Admitting: Internal Medicine

## 2013-08-20 VITALS — BP 122/70 | HR 76 | Ht 62.25 in | Wt 126.3 lb

## 2013-08-20 DIAGNOSIS — K5900 Constipation, unspecified: Secondary | ICD-10-CM

## 2013-08-20 DIAGNOSIS — R634 Abnormal weight loss: Secondary | ICD-10-CM

## 2013-08-20 DIAGNOSIS — R1084 Generalized abdominal pain: Secondary | ICD-10-CM

## 2013-08-20 DIAGNOSIS — R1013 Epigastric pain: Secondary | ICD-10-CM

## 2013-08-20 MED ORDER — NA SULFATE-K SULFATE-MG SULF 17.5-3.13-1.6 GM/177ML PO SOLN: 1.0000 | Freq: Once | ORAL | Status: DC

## 2013-08-20 NOTE — Progress Notes (Signed)
HISTORY OF PRESENT ILLNESS:  Sandra Powell is a 43 y.o. 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 She sent today, by Dr. Laurance Flatten, regarding chronic abdominal complaints. No previous formal evaluation for the same. She tells me that she has had a 3 year history of constipation associated with abdominal discomfort and bloating. Historically, she reports having had regular normal bowel movements. She has tried fiber without relief. She does notice his symptoms are improved after defecation. She also reports to me 20 pound weight loss over the past 3 months. No change in diet except for limitation of soda. In addition she reports epigastric discomfort, occasional heartburn, and nausea. No vomiting, melena, or hematochezia. No family history of inflammatory bowel disease or gastrointestinal malignancy. The patient was seen in the emergency room in June 4 syncope. Evaluation of outside laboratories and imaging are as follows. Abdominal ultrasound 09/25/2010 was normal. CT scan of the abdomen and pelvis with contrast 06/08/2013 revealed colonic diverticulosis but was otherwise unremarkable. Laboratories in June and July showed a normal comprehensive metabolic panel and CBC (hemoglobin 12.0). Urine pregnancy test was negative. Patient has had no abdominal or pelvic surgery.  REVIEW OF SYSTEMS:  All non-GI ROS negative except for sinus and allergy trouble, back pain, blood in urine, visual change, depression, fatigue, heart murmur, menstrual pain, muscle cramps, night sweats, shortness of breath, excessive thirst, excessive urination  Past Medical History  Diagnosis Date  . MVP (mitral valve prolapse)   . Syncope   . Diverticulosis   . Anemia   . Anxiety   . Depression     Past Surgical History  Procedure Laterality Date  . Tonsillectomy    . Induced abortion  2000    Social History Sandra Powell  reports that she has never smoked. She has never used  smokeless tobacco. She reports that she drinks alcohol. She reports that she uses illicit drugs (Marijuana).  family history includes Heart disease in her sister; Heart failure (age of onset: 60) in an other family member; Heart failure (age of onset: 82) in her sister; Mitral valve prolapse in her sister; Syncope episode in her father and sister; Uterine cancer in her mother.  No Known Allergies     PHYSICAL EXAMINATION: Vital signs: BP 122/70  Pulse 76  Ht 5' 2.25" (1.581 m)  Wt 126 lb 5 oz (57.295 kg)  BMI 22.92 kg/m2  LMP 08/10/2013  Constitutional: generally well-appearing, no acute distress Psychiatric: alert and oriented x3, cooperative Eyes: extraocular movements intact, anicteric, conjunctiva pink Mouth: oral pharynx moist, no lesions Neck: supple no lymphadenopathy Cardiovascular: heart regular rate and rhythm, no murmur Lungs: clear to auscultation bilaterally Abdomen: soft, nontender, nondistended, no obvious ascites, no peritoneal signs, normal bowel sounds, no organomegaly Rectal: Deferred until colonoscopy Extremities: no lower extremity edema bilaterally Skin: no lesions on visible extremities Neuro: No focal deficits. Normal reflexes.    ASSESSMENT:  #1. Chronic worsening abdominal discomfort and constipation. Suspect constipation predominant IBS #2. 20 pound weight loss over the past 3 months. Etiology unclear #3. Nausea #4. Epigastric discomfort   PLAN:  #1. Colonoscopy and upper endoscopy.The nature of the procedure, as well as the risks, benefits, and alternatives were carefully and thoroughly reviewed with the patient. Ample time for discussion and questions allowed. The patient understood, was satisfied, and agreed to proceed. Suprep prescribed. #2. If examinations are negative, consider Linzess and PPI with subsequent office followup

## 2013-08-20 NOTE — Patient Instructions (Signed)

## 2013-08-26 ENCOUNTER — Encounter: Payer: Self-pay | Admitting: Internal Medicine

## 2013-08-28 ENCOUNTER — Telehealth: Payer: Self-pay | Admitting: Cardiology

## 2013-08-28 NOTE — Telephone Encounter (Signed)
ROI faxed to [facility] [phone]/km

## 2013-09-02 ENCOUNTER — Ambulatory Visit (AMBULATORY_SURGERY_CENTER): Payer: BC Managed Care – PPO | Admitting: Internal Medicine

## 2013-09-02 ENCOUNTER — Encounter: Payer: Self-pay | Admitting: Internal Medicine

## 2013-09-02 ENCOUNTER — Other Ambulatory Visit: Payer: Self-pay

## 2013-09-02 VITALS — BP 130/68 | HR 71 | Temp 98.4°F | Resp 22 | Ht 62.25 in | Wt 126.0 lb

## 2013-09-02 DIAGNOSIS — K5730 Diverticulosis of large intestine without perforation or abscess without bleeding: Secondary | ICD-10-CM

## 2013-09-02 DIAGNOSIS — K2100 Gastro-esophageal reflux disease with esophagitis, without bleeding: Secondary | ICD-10-CM

## 2013-09-02 DIAGNOSIS — K5900 Constipation, unspecified: Secondary | ICD-10-CM

## 2013-09-02 DIAGNOSIS — R1013 Epigastric pain: Secondary | ICD-10-CM

## 2013-09-02 DIAGNOSIS — R634 Abnormal weight loss: Secondary | ICD-10-CM

## 2013-09-02 MED ORDER — LINACLOTIDE 145 MCG PO CAPS: 145.0000 ug | ORAL_CAPSULE | Freq: Every day | ORAL | Status: DC

## 2013-09-02 MED ORDER — OMEPRAZOLE 20 MG PO CPDR: 20.0000 mg | DELAYED_RELEASE_CAPSULE | Freq: Every day | ORAL | Status: DC

## 2013-09-02 MED ORDER — SODIUM CHLORIDE 0.9 % IV SOLN: 500.0000 mL | INTRAVENOUS | Status: DC

## 2013-09-02 NOTE — Op Note (Signed)
[place]  [address] [place], [address]   COLONOSCOPY PROCEDURE REPORT  PATIENT: Sandra Powell, Sandra Powell  MR#: [id number] BIRTHDATE: [birth year] , 64  yrs. old GENDER: Female ENDOSCOPIST: Eustace Quail, MD REFERRED FS:ELTRVU Laurance Flatten, M.D. PROCEDURE DATE:  09/02/2013 PROCEDURE:   Colonoscopy, diagnostic First Screening Colonoscopy - Avg.  risk and is 50 yrs.  old or older - No.  Prior Negative Screening - Now for repeat screening. N/A  History of Adenoma - Now for follow-up colonoscopy & has been > or = to 3 yrs.  N/A  Polyps Removed Today? No.  Recommend repeat exam, <10 yrs? No. ASA CLASS:   Class II INDICATIONS:Abdominal pain, Change in bowel habits, and Weight loss.  MEDICATIONS: MAC sedation, administered by CRNA and propofol (Diprivan) 350mg  IV DESCRIPTION OF PROCEDURE:   After the risks benefits and alternatives of the procedure were thoroughly explained, informed consent was obtained.  A digital rectal exam revealed no abnormalities of the rectum.   The LB [id number]  endoscope was introduced through the anus and advanced to the cecum, which was identified by both the appendix and ileocecal valve. No adverse events experienced.   The quality of the prep was excellent, using MoviPrep  The instrument was then slowly withdrawn as the colon was fully examined.  COLON FINDINGS: Moderate diverticulosis was noted in the left colon. The colon was otherwise normal.  There was no  inflammation, polyps or cancers unless previously stated.  Retroflexed views revealed internal hemorrhoids. The time to cecum=3 minutes 17 seconds.  Withdrawal time=7 minutes 44 seconds.  The scope was withdrawn and the procedure completed. COMPLICATIONS: There were no complications.  ENDOSCOPIC IMPRESSION: 1.   Moderate diverticulosis was noted in the left colon 2.   The colon was otherwise normal  RECOMMENDATIONS: 1.  Continue current colorectal screening recommendations  for "routine risk" patients with a repeat colonoscopy in 10 years. 2.  Upper endoscopy today (see report) 3. Linzess 145 mg daily (for bowels and discomfort)... 30 day voucher provided 4. Office follow up with Dr Henrene Pastor in 4-6 weeks   eSigned:  Eustace Quail, MD 09/02/2013 10:41 AM   cc: Redge Gainer, MD and The Patient

## 2013-09-02 NOTE — Op Note (Signed)
[place]  [address]. [place], [address]   ENDOSCOPY PROCEDURE REPORT  PATIENT: Sandra Powell, Sandra Powell  MR#: [id number] BIRTHDATE: 10/16/1970 , 43  yrs. old GENDER: Female ENDOSCOPIST: Eustace Quail, MD REFERRED BY:  Redge Gainer, M.D. PROCEDURE DATE:  09/02/2013 PROCEDURE:  EGD, diagnostic ASA CLASS:     Class II INDICATIONS:  abdominal pain.   Weight loss. MEDICATIONS: MAC sedation, administered by CRNA and propofol (Diprivan) 50mg  IV TOPICAL ANESTHETIC: none  DESCRIPTION OF PROCEDURE: After the risks benefits and alternatives of the procedure were thoroughly explained, informed consent was obtained.  The LB [id number] endoscope was introduced through the mouth and advanced to the second portion of the duodenum. Without limitations.  The instrument was slowly withdrawn as the mucosa was fully examined.      Mild distal esophagitis.  Normal stomach and duodenum.  Retroflexed views revealed a hiatal hernia.     The scope was then withdrawn from the patient and the procedure completed.  COMPLICATIONS: There were no complications. ENDOSCOPIC IMPRESSION: 1. GERD with Mild distal esophagitis. 2. Otherwise normal EGD  RECOMMENDATIONS: 1.  Anti-reflux regimen to be followed 2.  Prescribe omeprazole 20 mg daily; #30; 11 refills 3.  Call for office visit to be seen in 4-6 weeks  REPEAT EXAM:  eSigned:  Eustace Quail, MD 09/02/2013 10:49 AM   GU:[id number] Laurance Flatten, MD and The Patient

## 2013-09-02 NOTE — Patient Instructions (Signed)
YOU HAD AN ENDOSCOPIC PROCEDURE TODAY AT THE [facility]: Refer to the procedure report that was given to you for any specific questions about what was found during the examination.  If the procedure report does not answer your questions, please call your gastroenterologist to clarify.  If you requested that your care partner not be given the details of your procedure findings, then the procedure report has been included in a sealed envelope for you to review at your convenience later.  YOU SHOULD EXPECT: Some feelings of bloating in the abdomen. Passage of more gas than usual.  Walking can help get rid of the air that was put into your GI tract during the procedure and reduce the bloating. If you had a lower endoscopy (such as a colonoscopy or flexible sigmoidoscopy) you may notice spotting of blood in your stool or on the toilet paper. If you underwent a bowel prep for your procedure, then you may not have a normal bowel movement for a few days.  DIET: Your first meal following the procedure should be a light meal and then it is ok to progress to your normal diet.  A half-sandwich or bowl of soup is an example of a good first meal.  Heavy or fried foods are harder to digest and may make you feel nauseous or bloated.  Likewise meals heavy in dairy and vegetables can cause extra gas to form and this can also increase the bloating.  Drink plenty of fluids but you should avoid alcoholic beverages for 24 hours.  ACTIVITY: Your care partner should take you home directly after the procedure.  You should plan to take it easy, moving slowly for the rest of the day.  You can resume normal activity the day after the procedure however you should NOT DRIVE or use heavy machinery for 24 hours (because of the sedation medicines used during the test).    SYMPTOMS TO REPORT IMMEDIATELY: A gastroenterologist can be reached at any hour.  During normal business hours, 8:30 AM to 5:00 PM Monday through Friday,  call [phone].  After hours and on weekends, please call the GI answering service at [phone] who will take a message and have the physician on call contact you.   Following lower endoscopy (colonoscopy or flexible sigmoidoscopy):  Excessive amounts of blood in the stool  Significant tenderness or worsening of abdominal pains  Swelling of the abdomen that is new, acute  Fever of 100F or higher  Following upper endoscopy (EGD)  Vomiting of blood or coffee ground material  New chest pain or pain under the shoulder blades  Painful or persistently difficult swallowing  New shortness of breath  Fever of 100F or higher  Black, tarry-looking stools  FOLLOW UP: If any biopsies were taken you will be contacted by phone or by letter within the next 1-3 weeks.  Call your gastroenterologist if you have not heard about the biopsies in 3 weeks.  Our staff will call the home number listed on your records the next business day following your procedure to check on you and address any questions or concerns that you may have at that time regarding the information given to you following your procedure. This is a courtesy call and so if there is no answer at the home number and we have not heard from you through the emergency physician on call, we will assume that you have returned to your regular daily activities without incident.  SIGNATURES/CONFIDENTIALITY: You and/or your care   partner have signed paperwork which will be entered into your electronic medical record.  These signatures attest to the fact that that the information above on your After Visit Summary has been reviewed and is understood.  Full responsibility of the confidentiality of this discharge information lies with you and/or your care-partner.    Handouts were given to your care partner on diverticulosis, a high fiber diet with liberal fluid intake, and GERD. You may resume your current medications today.  Dr. Henrene Pastor gave you a  voucher for Linzess.  A rx was sent to [facility],  for omeprazole 20 mg. Please call if any questions or concerns. You need to see Dr. Henrene Pastor for a follow up appointment in 4 - 6 weeks.  Call to schedule appointment within a couple of days.

## 2013-09-02 NOTE — Progress Notes (Signed)
No problems noted in the recovery room. maw 

## 2013-09-02 NOTE — Progress Notes (Signed)
A/ox3, pleased with MAC, report to RN 

## 2013-09-03 ENCOUNTER — Telehealth: Payer: Self-pay

## 2013-09-03 NOTE — Telephone Encounter (Signed)
Left a message at [phone] for the pt to call us back if any questions or concerns. maw

## 2013-09-14 ENCOUNTER — Telehealth: Payer: Self-pay | Admitting: Cardiology

## 2013-09-14 NOTE — Telephone Encounter (Signed)
Records rec Via Fax From [place], [place] Interoffice to [place] 9.14.15/km

## 2013-10-16 ENCOUNTER — Other Ambulatory Visit: Payer: Self-pay

## 2013-10-26 ENCOUNTER — Encounter: Payer: Self-pay | Admitting: Internal Medicine

## 2013-10-26 ENCOUNTER — Ambulatory Visit (INDEPENDENT_AMBULATORY_CARE_PROVIDER_SITE_OTHER): Payer: BC Managed Care – PPO | Admitting: Internal Medicine

## 2013-10-26 VITALS — BP 114/72 | HR 68 | Ht 65.25 in | Wt 131.4 lb

## 2013-10-26 DIAGNOSIS — K589 Irritable bowel syndrome without diarrhea: Secondary | ICD-10-CM

## 2013-10-26 DIAGNOSIS — K219 Gastro-esophageal reflux disease without esophagitis: Secondary | ICD-10-CM

## 2013-10-26 NOTE — Patient Instructions (Signed)
Please follow up with Dr. Perry in one year 

## 2013-10-26 NOTE — Progress Notes (Signed)
HISTORY OF PRESENT ILLNESS:  Sandra Powell is a 43 y.o. female who was evaluated in the office 08/20/2013 regarding chronic worsening abdominal discomfort, constipation, nausea with epigastric discomfort, and weight loss. See that dictation. She subsequently underwent colonoscopy and upper endoscopy on 09/02/2013. Colonoscopy revealed moderate left-sided diverticulosis but was otherwise normal. Upper endoscopy revealed mild esophagitis but was otherwise normal. Previous CT scan was negative. She was treated with omeprazole and Linzess 145 g daily. she presents today for follow-up. Her nausea and epigastric discomfort have resolved. She is now experiencing bowel movements after each meal. These are loose. Her abdomen feels much better. She has gained 5 pounds. She is pleased. No new complaints aside from frequent postprandial loose stools  REVIEW OF SYSTEMS:  All non-GI ROS negative except for anxiety  Past Medical History  Diagnosis Date  . MVP (mitral valve prolapse)   . Syncope   . Diverticulosis   . Anemia   . Anxiety   . Depression   . GERD (gastroesophageal reflux disease)     Past Surgical History  Procedure Laterality Date  . Tonsillectomy    . Induced abortion  2000    Social History Makala Fetterolf  reports that she has never smoked. She has never used smokeless tobacco. She reports that she drinks alcohol. She reports that she uses illicit drugs (Marijuana).  family history includes Heart disease in her sister; Heart failure (age of onset: 28) in an other family member; Heart failure (age of onset: 54) in her sister; Mitral valve prolapse in her sister; Syncope episode in her father and sister; Uterine cancer in her mother. There is no history of Colon cancer, Esophageal cancer, Rectal cancer, or Stomach cancer.  No Known Allergies     PHYSICAL EXAMINATION: Vital signs: BP 114/72  Pulse 68  Ht 5' 5.25" (1.657 m)  Wt 131 lb 6.4 oz (59.603 kg)  BMI 21.71 kg/m2  LMP  10/26/2013 General: Well-developed, well-nourished, no acute distress HEENT: Sclerae are anicteric, conjunctiva pink. Oral mucosa intact Lungs: Clear Heart: Regular Abdomen: soft, nontender, nondistended, no obvious ascites, no peritoneal signs, normal bowel sounds. No organomegaly. Extremities: No edema Psychiatric: alert and oriented x3. Cooperative     ASSESSMENT:  #1. GERD. Symptoms are relieved with PPI #2. Constipation predominant IBS. Improved Linzess #3. New complaints of postprandial loose stools. Likely medication side effect from Linzess #4. Weight loss. Resolved. Weight gain documented   PLAN:  #1. Reflux precautions #2. Continue PPI #3. Try Linzess every other day to see if this helps with loose stools. #4. Routine follow-up in one year. Sooner if needed

## 2014-01-06 ENCOUNTER — Encounter: Payer: Self-pay | Admitting: Cardiology

## 2014-01-06 ENCOUNTER — Encounter: Payer: Self-pay | Admitting: Family Medicine

## 2014-01-06 ENCOUNTER — Ambulatory Visit (INDEPENDENT_AMBULATORY_CARE_PROVIDER_SITE_OTHER): Payer: BLUE CROSS/BLUE SHIELD | Admitting: Cardiology

## 2014-01-06 ENCOUNTER — Other Ambulatory Visit (INDEPENDENT_AMBULATORY_CARE_PROVIDER_SITE_OTHER): Payer: BLUE CROSS/BLUE SHIELD

## 2014-01-06 VITALS — Ht 65.25 in | Wt 134.0 lb

## 2014-01-06 DIAGNOSIS — R002 Palpitations: Secondary | ICD-10-CM

## 2014-01-06 MED ORDER — PROPRANOLOL HCL 80 MG PO TABS: 80.0000 mg | ORAL_TABLET | Freq: Two times a day (BID) | ORAL | Status: DC

## 2014-01-06 NOTE — Patient Instructions (Signed)
Please stop your Atenolol. Start Propranolol 40 mg twice a day for 3 days then increase to 80 mg twice a day. Continue all other medications as listed.  Please have blood work today at [facility] (CBC,BMP and TSH)  Follow up with Dr Percival Spanish in the [place] office.

## 2014-01-06 NOTE — Progress Notes (Signed)
HPI The patient presents as a new patient for evaluation of syncope.  She has had an extensive history of this and a work up most recently in [place].  I reviewed an echo that she had done in April.  There is some very mild MVP but no significant regurgitation.  She was started on Atenolol which helped her nerves but she has still passed out, now twice, on this med.  She says that most recently was on Christmas.  She was seated.  She has a bit of a prodrome and then went completely out.  Her boyfriend said that she was not breathing.  They laid her on the floor but she started to breath.  They did not call EMS.  She said she had two near syncope episodes the two days after this but it did not progress.  Since that time she says "I just don't feel as good."  She has not had palpitations and she does not report orthostatic symptoms.  She denies any chest pain, neck or arm pain.    No Known Allergies  Current Outpatient Prescriptions  Medication Sig Dispense Refill  . atenolol (TENORMIN) 50 MG tablet Take 1 tablet (50 mg total) by mouth daily. 30 tablet 11  . cyclobenzaprine (FLEXERIL) 5 MG tablet Take 1 tablet (5 mg total) by mouth 3 (three) times daily as needed for muscle spasms. 60 tablet 1  . Linaclotide (LINZESS) 145 MCG CAPS capsule Take 1 capsule (145 mcg total) by mouth daily. 30 capsule 3  . naproxen sodium (ANAPROX) 220 MG tablet Take 440 mg by mouth daily as needed (back pain.).    Marland Kitchen omeprazole (PRILOSEC) 20 MG capsule Take 1 capsule (20 mg total) by mouth daily. 30 capsule 11   No current facility-administered medications for this visit.    Past Medical History  Diagnosis Date  . MVP (mitral valve prolapse)   . Syncope   . Diverticulosis   . Anemia   . Anxiety   . Depression   . GERD (gastroesophageal reflux disease)     Past Surgical History  Procedure Laterality Date  . Tonsillectomy    . Induced abortion  2000     ROS: Positive for pains, muscle cramps, headaches,  dizziness, nausea, vomiting, constipation. Otherwise as stated in the HPI and negative for all other systems.  PHYSICAL EXAM Ht 5' 5.25" (1.657 m)  Wt 134 lb (60.782 kg)  BMI 22.14 kg/m2 GENERAL:  Well appearing HEENT:  Pupils equal round and reactive, fundi not visualized, oral mucosa unremarkable NECK:  No jugular venous distention, waveform within normal limits, carotid upstroke brisk and symmetric, no bruits, no thyromegaly LYMPHATICS:  No cervical, inguinal adenopathy LUNGS:  Clear to auscultation bilaterally BACK:  No CVA tenderness CHEST:  Unremarkable HEART:  PMI not displaced or sustained,S1 and S2 within normal limits, no S3, no S4, no clicks, no rubs, no murmurs ABD:  Flat, positive bowel sounds normal in frequency in pitch, no bruits, no rebound, no guarding, no midline pulsatile mass, no hepatomegaly, no splenomegaly EXT:  2 plus pulses throughout, no edema, no cyanosis no clubbing SKIN:  No rashes no nodules NEURO:  Cranial nerves II through XII grossly intact, motor grossly intact throughout [facility]:  Cognitively intact, oriented to person place and time   EKG:  Sinus rhythm, rate 72, axis within normal limits, intervals within normal limits, no acute ST-T wave changes  01/06/2014  ASSESSMENT AND PLAN  SYNCOPE:   The etiology is not clear but  possibly vagal.  I am going to see if a switch to propranolol with improved blood brain penetration might not help as she did have improvement in her anxiety with atenolol and perhaps more improvement with a low dose with up titration of propranolol.  This might also help the syncope .  Of note she was not orthostatic in the office today.  Given the recent work up I don't think further testing is needed unless symptoms continue.  She might need a loop recorder or tilt table test.  I will check a BMET, CBC and TSH.   ANXIETY:  As above.    MVP:  I did review the results of the echo.  No further imaging is needed at this time.

## 2014-01-07 LAB — BMP8+EGFR
BUN/Creatinine Ratio: 28 — ABNORMAL HIGH (ref 9–23)
BUN: 16 mg/dL (ref 6–24)
CALCIUM: 9.3 mg/dL (ref 8.7–10.2)
CO2: 24 mmol/L (ref 18–29)
Chloride: 102 mmol/L (ref 97–108)
Creatinine, Ser: 0.58 mg/dL (ref 0.57–1.00)
GFR calc non Af Amer: 113 mL/min/{1.73_m2} (ref >59)
GFR, EST AFRICAN AMERICAN: 131 mL/min/{1.73_m2} (ref >59)
GLUCOSE: 81 mg/dL (ref 65–99)
Potassium: 3.9 mmol/L (ref 3.5–5.2)
SODIUM: 139 mmol/L (ref 134–144)

## 2014-01-07 LAB — CBC WITH DIFFERENTIAL
Basophils Absolute: 0 10*3/uL (ref 0.0–0.2)
Basos: 0 %
EOS ABS: 0.1 10*3/uL (ref 0.0–0.4)
Eos: 1 %
HCT: 34.7 % (ref 34.0–46.6)
Hemoglobin: 11.8 g/dL (ref 11.1–15.9)
IMMATURE GRANS (ABS): 0 10*3/uL (ref 0.0–0.1)
Immature Granulocytes: 0 %
Lymphocytes Absolute: 3.4 10*3/uL — ABNORMAL HIGH (ref 0.7–3.1)
Lymphs: 37 %
MCH: 27.3 pg (ref 26.6–33.0)
MCHC: 34 g/dL (ref 31.5–35.7)
MCV: 80 fL (ref 79–97)
MONOCYTES: 10 %
Monocytes Absolute: 0.9 10*3/uL (ref 0.1–0.9)
Neutrophils Absolute: 4.8 10*3/uL (ref 1.4–7.0)
Neutrophils Relative %: 52 %
PLATELETS: 301 10*3/uL (ref 150–379)
RBC: 4.33 x10E6/uL (ref 3.77–5.28)
RDW: 14 % (ref 12.3–15.4)
WBC: 9.3 10*3/uL (ref 3.4–10.8)

## 2014-01-07 LAB — TSH: TSH: 4.79 u[IU]/mL — AB (ref 0.450–4.500)

## 2014-01-11 ENCOUNTER — Other Ambulatory Visit: Payer: Self-pay

## 2014-01-11 DIAGNOSIS — R7989 Other specified abnormal findings of blood chemistry: Secondary | ICD-10-CM

## 2014-01-27 ENCOUNTER — Encounter: Payer: Self-pay | Admitting: Cardiology

## 2014-01-27 ENCOUNTER — Ambulatory Visit (INDEPENDENT_AMBULATORY_CARE_PROVIDER_SITE_OTHER): Payer: BLUE CROSS/BLUE SHIELD | Admitting: Cardiology

## 2014-01-27 VITALS — BP 110/82 | HR 76 | Ht 62.0 in | Wt 134.0 lb

## 2014-01-27 DIAGNOSIS — R55 Syncope and collapse: Secondary | ICD-10-CM

## 2014-01-27 MED ORDER — ATENOLOL 50 MG PO TABS: 50.0000 mg | ORAL_TABLET | Freq: Every day | ORAL | Status: DC

## 2014-01-27 NOTE — Progress Notes (Signed)
HPI The patient presents as a new patient for evaluation of syncope.  She has had an extensive history of this and a work up most recently in [place].  I reviewed an echo that she had done in April.  There is some very mild MVP but no significant regurgitation.  She was started on Atenolol which helped her nerves but she has still passed out, now twice, on this med.  At the last visit we reviewed her most recent episodes. These are described elsewhere. I did switch to propranolol to see if she did better with this beta blocker.  However, she hasn't really tolerated this. It makes her feel like "I might have a heart attack."  She gets chest discomfort. She feels like her "Heart is working too hard."  She has had no further syncope.    No Known Allergies  Current Outpatient Prescriptions  Medication Sig Dispense Refill  . Linaclotide (LINZESS) 145 MCG CAPS capsule Take 1 capsule (145 mcg total) by mouth daily. (Patient taking differently: Take 145 mcg by mouth every other day. ) 30 capsule 3  . omeprazole (PRILOSEC) 20 MG capsule Take 1 capsule (20 mg total) by mouth daily. 30 capsule 11  . propranolol (INDERAL) 80 MG tablet Take 1 tablet (80 mg total) by mouth 2 (two) times daily. 60 tablet 6  . cyclobenzaprine (FLEXERIL) 5 MG tablet Take 1 tablet (5 mg total) by mouth 3 (three) times daily as needed for muscle spasms. (Patient not taking: Reported on 01/27/2014) 60 tablet 1  . naproxen sodium (ANAPROX) 220 MG tablet Take 440 mg by mouth daily as needed (back pain.).     No current facility-administered medications for this visit.    Past Medical History  Diagnosis Date  . MVP (mitral valve prolapse)   . Syncope   . Diverticulosis   . Anemia   . Anxiety   . Depression   . GERD (gastroesophageal reflux disease)     Past Surgical History  Procedure Laterality Date  . Tonsillectomy    . Induced abortion  2000     ROS: Positive for pains, muscle cramps, headaches, dizziness, nausea,  vomiting, constipation. Otherwise as stated in the HPI and negative for all other systems.  PHYSICAL EXAM BP 110/82 mmHg  Pulse 76  Ht 5\' 2"  (1.575 m)  Wt 134 lb (60.782 kg)  BMI 24.50 kg/m2 GENERAL:  Well appearing NECK:  No jugular venous distention, waveform within normal limits, carotid upstroke brisk and symmetric, no bruits, no thyromegaly LUNGS:  Clear to auscultation bilaterally HEART:  PMI not displaced or sustained,S1 and S2 within normal limits, no S3, no S4, no clicks, no rubs, no murmurs ABD:  Flat, positive bowel sounds normal in frequency in pitch, no bruits, no rebound, no guarding, no midline pulsatile mass, no hepatomegaly, no splenomegaly EXT:  2 plus pulses throughout, no edema, no cyanosis no clubbing   ASSESSMENT AND PLAN  SYNCOPE:   The etiology is not clear but possibly vagal.  She wants to go back to her previous dose of atenolol as she doesn't feel as well on propranolol. She's not had any further events. Given the recent work up I don't think further testing is needed unless symptoms continue.  She might need a loop recorder or tilt table test.  If she has another event I will refer her to EP.   MVP:  I did review the results of the echo.  No further imaging is needed at this time.

## 2014-01-27 NOTE — Patient Instructions (Addendum)
Please stop Propranolol and start Atenolol 50 mg a day. Continue all other medications as listed.  Follow up as needed.  Thank you for choosing [facility]!!

## 2014-03-10 ENCOUNTER — Encounter: Payer: Self-pay | Admitting: Family

## 2014-03-10 ENCOUNTER — Ambulatory Visit (INDEPENDENT_AMBULATORY_CARE_PROVIDER_SITE_OTHER): Payer: BLUE CROSS/BLUE SHIELD | Admitting: Family

## 2014-03-10 VITALS — BP 134/72 | HR 77 | Temp 98.6°F | Ht 62.0 in | Wt 134.6 lb

## 2014-03-10 DIAGNOSIS — J029 Acute pharyngitis, unspecified: Secondary | ICD-10-CM

## 2014-03-10 DIAGNOSIS — J019 Acute sinusitis, unspecified: Secondary | ICD-10-CM

## 2014-03-10 DIAGNOSIS — R059 Cough, unspecified: Secondary | ICD-10-CM

## 2014-03-10 DIAGNOSIS — R05 Cough: Secondary | ICD-10-CM | POA: Diagnosis not present

## 2014-03-10 LAB — POCT RAPID STREP A (OFFICE): Rapid Strep A Screen: NEGATIVE

## 2014-03-10 LAB — POCT INFLUENZA A/B: INFLUENZA A, POC: NEGATIVE

## 2014-03-10 MED ORDER — AMOXICILLIN-POT CLAVULANATE 875-125 MG PO TABS: 1.0000 | ORAL_TABLET | Freq: Two times a day (BID) | ORAL | Status: DC

## 2014-03-10 MED ORDER — BENZONATATE 200 MG PO CAPS: 200.0000 mg | ORAL_CAPSULE | Freq: Three times a day (TID) | ORAL | Status: DC | PRN

## 2014-03-10 NOTE — Patient Instructions (Signed)
Sinusitis Sinusitis is redness, soreness, and inflammation of the paranasal sinuses. Paranasal sinuses are air pockets within the bones of your face (beneath the eyes, the middle of the forehead, or above the eyes). In healthy paranasal sinuses, mucus is able to drain out, and air is able to circulate through them by way of your nose. However, when your paranasal sinuses are inflamed, mucus and air can become trapped. This can allow bacteria and other germs to grow and cause infection. Sinusitis can develop quickly and last only a short time (acute) or continue over a long period (chronic). Sinusitis that lasts for more than 12 weeks is considered chronic.  CAUSES  Causes of sinusitis include:  Allergies.  Structural abnormalities, such as displacement of the cartilage that separates your nostrils (deviated septum), which can decrease the air flow through your nose and sinuses and affect sinus drainage.  Functional abnormalities, such as when the small hairs (cilia) that line your sinuses and help remove mucus do not work properly or are not present. SIGNS AND SYMPTOMS  Symptoms of acute and chronic sinusitis are the same. The primary symptoms are pain and pressure around the affected sinuses. Other symptoms include:  Upper toothache.  Earache.  Headache.  Bad breath.  Decreased sense of smell and taste.  A cough, which worsens when you are lying flat.  Fatigue.  Fever.  Thick drainage from your nose, which often is green and may contain pus (purulent).  Swelling and warmth over the affected sinuses. DIAGNOSIS  Your health care provider will perform a physical exam. During the exam, your health care provider may:  Look in your nose for signs of abnormal growths in your nostrils (nasal polyps).  Tap over the affected sinus to check for signs of infection.  View the inside of your sinuses (endoscopy) using an imaging device that has a light attached (endoscope). If your health  care provider suspects that you have chronic sinusitis, one or more of the following tests may be recommended:  Allergy tests.  Nasal culture. A sample of mucus is taken from your nose, sent to a lab, and screened for bacteria.  Nasal cytology. A sample of mucus is taken from your nose and examined by your health care provider to determine if your sinusitis is related to an allergy. TREATMENT  Most cases of acute sinusitis are related to a viral infection and will resolve on their own within 10 days. Sometimes medicines are prescribed to help relieve symptoms (pain medicine, decongestants, nasal steroid sprays, or saline sprays).  However, for sinusitis related to a bacterial infection, your health care provider will prescribe antibiotic medicines. These are medicines that will help kill the bacteria causing the infection.  Rarely, sinusitis is caused by a fungal infection. In theses cases, your health care provider will prescribe antifungal medicine. For some cases of chronic sinusitis, surgery is needed. Generally, these are cases in which sinusitis recurs more than 3 times per year, despite other treatments. HOME CARE INSTRUCTIONS   Drink plenty of water. Water helps thin the mucus so your sinuses can drain more easily.  Use a humidifier.  Inhale steam 3 to 4 times a day (for example, sit in the bathroom with the shower running).  Apply a warm, moist washcloth to your face 3 to 4 times a day, or as directed by your health care provider.  Use saline nasal sprays to help moisten and clean your sinuses.  Take medicines only as directed by your health care provider.    If you were prescribed either an antibiotic or antifungal medicine, finish it all even if you start to feel better. SEEK IMMEDIATE MEDICAL CARE IF:  You have increasing pain or severe headaches.  You have nausea, vomiting, or drowsiness.  You have swelling around your face.  You have vision problems.  You have a stiff  neck.  You have difficulty breathing. MAKE SURE YOU:   Understand these instructions.  Will watch your condition.  Will get help right away if you are not doing well or get worse. Document Released: 12/18/2004 Document Revised: 05/04/2013 Document Reviewed: 01/02/2011 Exit[employer] Patient Information 2015 [employer]. This information is not intended to replace advice given to you by your health care provider. Make sure you discuss any questions you have with your health care provider.  - Take meds as prescribed - Use a cool mist humidifier  -Use saline nose sprays frequently -Saline irrigations of the nose can be very helpful if done frequently.  * 4X daily for 1 week*  * Use of a nettie pot can be helpful with this. Follow directions with this* -Force fluids -For any cough or congestion  Use plain Mucinex- regular strength or max strength is fine   * Children- consult with Pharmacist for dosing -For fever or aces or pains- take tylenol or ibuprofen appropriate for age and weight.  * for fevers greater than 101 orally you may alternate ibuprofen and tylenol every  3 hours. -Throat lozenges if help   Zephan Beauchaine, FNP  

## 2014-03-10 NOTE — Progress Notes (Signed)
Subjective:    Patient ID: Sandra Powell, female    DOB: [birth year], 44 y.o.   MRN: [id number]  URI  This is a new problem. The current episode started in the past 7 days (Friday). The problem has been waxing and waning. The maximum temperature recorded prior to her arrival was 100.4 - 100.9 F. Associated symptoms include congestion, coughing, headaches, rhinorrhea, sinus pain, sneezing, a sore throat, swollen glands and wheezing. Pertinent negatives include no diarrhea, dysuria, ear pain, nausea or plugged ear sensation. She has tried antihistamine, decongestant, increased fluids, NSAIDs, sleep and acetaminophen for the symptoms. The treatment provided mild relief.      Review of Systems  Constitutional: Negative.   HENT: Positive for congestion, rhinorrhea, sneezing and sore throat. Negative for ear pain.   Eyes: Negative.   Respiratory: Positive for cough and wheezing. Negative for shortness of breath.   Cardiovascular: Negative.  Negative for palpitations.  Gastrointestinal: Negative.  Negative for nausea and diarrhea.  Endocrine: Negative.   Genitourinary: Negative.  Negative for dysuria.  Musculoskeletal: Negative.   Neurological: Positive for headaches.  Hematological: Negative.   Psychiatric/Behavioral: Negative.   All other systems reviewed and are negative.      Objective:   Physical Exam  Constitutional: She is oriented to person, place, and time. She appears well-developed and well-nourished. No distress.  HENT:  Head: Normocephalic and atraumatic.  Right Ear: External ear normal.  Left Ear: External ear normal.  Nose: Right sinus exhibits maxillary sinus tenderness. Right sinus exhibits no frontal sinus tenderness. Left sinus exhibits maxillary sinus tenderness. Left sinus exhibits no frontal sinus tenderness.  Mouth/Throat: Oropharynx is clear and moist.  Nasal passage erythemas with mild swelling  Oropharynx erythemas   Eyes: Pupils are equal, round, and  reactive to light.  Neck: Normal range of motion. Neck supple. No thyromegaly present.  Cardiovascular: Normal rate, regular rhythm, normal heart sounds and intact distal pulses.   No murmur heard. Pulmonary/Chest: Effort normal and breath sounds normal. No respiratory distress. She has no wheezes.  Abdominal: Soft. Bowel sounds are normal. She exhibits no distension. There is no tenderness.  Musculoskeletal: Normal range of motion. She exhibits no edema or tenderness.  Neurological: She is alert and oriented to person, place, and time. She has normal reflexes. No cranial nerve deficit.  Skin: Skin is warm and dry.  Psychiatric: She has a normal mood and affect. Her behavior is normal. Judgment and thought content normal.  Vitals reviewed.     BP 134/72 mmHg  Pulse 77  Temp(Src) 98.6 F (37 C) (Oral)  Ht 5\' 2"  (1.575 m)  Wt 134 lb 9.6 oz (61.054 kg)  BMI 24.61 kg/m2  LMP 03/04/2014     Assessment & Plan:  1. Sore throat - POCT Influenza A/B - POCT rapid strep A  2. Cough - POCT Influenza A/B - POCT rapid strep A - benzonatate (TESSALON) 200 MG capsule; Take 1 capsule (200 mg total) by mouth 3 (three) times daily as needed.  Dispense: 30 capsule; Refill: 1  3. Acute sinusitis, recurrence not specified, unspecified location -- Take meds as prescribed - Use a cool mist humidifier  -Use saline nose sprays frequently -Saline irrigations of the nose can be very helpful if done frequently.  * 4X daily for 1 week*  * Use of a nettie pot can be helpful with this. Follow directions with this* -Force fluids -For any cough or congestion  Use plain Mucinex- regular strength or max strength is fine   *  Children- consult with Pharmacist for dosing -For fever or aces or pains- take tylenol or ibuprofen appropriate for age and weight.  * for fevers greater than 101 orally you may alternate ibuprofen and tylenol every  3 hours. -Throat lozenges if help - amoxicillin-clavulanate  (AUGMENTIN) 875-125 MG per tablet; Take 1 tablet by mouth 2 (two) times daily.  Dispense: 14 tablet; Refill: 0 - benzonatate (TESSALON) 200 MG capsule; Take 1 capsule (200 mg total) by mouth 3 (three) times daily as needed.  Dispense: 30 capsule; Refill: [place], FNP

## 2014-07-07 ENCOUNTER — Encounter: Payer: Self-pay | Admitting: Physician Assistant

## 2014-07-07 ENCOUNTER — Ambulatory Visit (INDEPENDENT_AMBULATORY_CARE_PROVIDER_SITE_OTHER): Payer: BLUE CROSS/BLUE SHIELD | Admitting: Physician Assistant

## 2014-07-07 VITALS — BP 131/76 | HR 77 | Temp 97.7°F

## 2014-07-07 DIAGNOSIS — R079 Chest pain, unspecified: Secondary | ICD-10-CM

## 2014-07-07 DIAGNOSIS — B373 Candidiasis of vulva and vagina: Secondary | ICD-10-CM | POA: Diagnosis not present

## 2014-07-07 DIAGNOSIS — B3731 Acute candidiasis of vulva and vagina: Secondary | ICD-10-CM

## 2014-07-07 DIAGNOSIS — N3090 Cystitis, unspecified without hematuria: Secondary | ICD-10-CM

## 2014-07-07 DIAGNOSIS — R399 Unspecified symptoms and signs involving the genitourinary system: Secondary | ICD-10-CM

## 2014-07-07 DIAGNOSIS — I341 Nonrheumatic mitral (valve) prolapse: Secondary | ICD-10-CM | POA: Diagnosis not present

## 2014-07-07 LAB — POCT URINALYSIS DIPSTICK
Bilirubin, UA: NEGATIVE
GLUCOSE UA: NEGATIVE
Ketones, UA: NEGATIVE
Nitrite, UA: NEGATIVE
PH UA: 6
PROTEIN UA: NEGATIVE
SPEC GRAV UA: 1.015
UROBILINOGEN UA: NEGATIVE

## 2014-07-07 LAB — POCT UA - MICROSCOPIC ONLY
Casts, Ur, LPF, POC: NEGATIVE
Crystals, Ur, HPF, POC: NEGATIVE
MUCUS UA: NEGATIVE
YEAST UA: NEGATIVE

## 2014-07-07 MED ORDER — CIPROFLOXACIN HCL 500 MG PO TABS: 500.0000 mg | ORAL_TABLET | Freq: Two times a day (BID) | ORAL | Status: DC

## 2014-07-07 MED ORDER — FLUCONAZOLE 150 MG PO TABS: 150.0000 mg | ORAL_TABLET | Freq: Once | ORAL | Status: DC

## 2014-07-07 NOTE — Progress Notes (Signed)
Subjective:    Patient ID: Sandra Powell, female    DOB: 1970-08-18, 44 y.o.   MRN: [id number]  HPI  44 y/o female presents with RLQ pain, back pain( right side) She also had 2-3 episodes of intermittent left sided chest pain that went under her left breast x 2 days.  She sees Dr.Hochrein Immunologist) for mild Mitral Valve Prolapse. She called his office and is scheduled to see him August 29th.       Review of Systems  Constitutional: Positive for appetite change (decreased ). Negative for fever, chills, diaphoresis and fatigue.  HENT: Negative.   Respiratory: Positive for shortness of breath (associated with episodes of CP). Negative for wheezing.   Cardiovascular: Positive for chest pain (2-3 times, lasting 5 minutes, pressure/heaviness on chest and back , unsure if relieved with resting ). Negative for palpitations and leg swelling.  Gastrointestinal: Positive for nausea and abdominal pain (RLQ pain, was constant initially but has decreased and is not hurting today ). Negative for vomiting, diarrhea, constipation and blood in stool.  Endocrine: Positive for polyuria.  Genitourinary: Positive for frequency and flank pain. Negative for dysuria and hematuria.       Darkened urine   Musculoskeletal: Positive for back pain (right sided, occasional shooting up her back or down left or right leg) and neck pain (relieved with Flexeril ).       Objective:   Physical Exam  Constitutional: She is oriented to person, place, and time. She appears well-developed and well-nourished. No distress.  Cardiovascular: Normal rate, regular rhythm and normal heart sounds.  Exam reveals no gallop and no friction rub.   No murmur heard. Pulmonary/Chest: Effort normal and breath sounds normal. No respiratory distress. She has no wheezes. She has no rales. She exhibits no tenderness.  Musculoskeletal: She exhibits no edema or tenderness.  Neurological: She is alert and oriented to person, place, and time.    Skin: She is not diaphoretic.  Psychiatric: She has a normal mood and affect. Her behavior is normal. Judgment and thought content normal.  Nursing note and vitals reviewed.  Results for orders placed or performed in visit on 07/07/14  POCT urinalysis dipstick  Result Value Ref Range   Color, UA yellow    Clarity, UA clear    Glucose, UA neg    Bilirubin, UA neg    Ketones, UA neg    Spec Grav, UA 1.015    Blood, UA small    pH, UA 6.0    Protein, UA neg    Urobilinogen, UA negative    Nitrite, UA neg    Leukocytes, UA moderate (2+) (A) Negative  POCT UA - Microscopic Only  Result Value Ref Range   WBC, Ur, HPF, POC 5-10    RBC, urine, microscopic 10-20    Bacteria, U Microscopic occ    Mucus, UA neg    Epithelial cells, urine per micros moderate    Crystals, Ur, HPF, POC neg    Casts, Ur, LPF, POC neg    Yeast, UA neg           Assessment & Plan:  1. UTI symptoms  - POCT urinalysis dipstick - POCT UA - Microscopic Only - Urine culture  2. Mitral valve prolapse - Keep f/u with Dr. Percival Spanish in August   3. Cystitis - Drink plenty of water and non-caffeine fluids - ciprofloxacin (CIPRO) 500 MG tablet; Take 1 tablet (500 mg total) by mouth 2 (two) times daily.  Dispense: 20 tablet; Refill: 0  4. Vulvovaginal candidiasis  - fluconazole (DIFLUCAN) 150 MG tablet; Take 1 tablet (150 mg total) by mouth once.  Dispense: 1 tablet; Refill: 0  5. Chest pain, unspecified chest pain type -  - EKG 12-Lead WNL- RSR with no abnormalities noted. Discussed with patient that her episodes of CP may have been due to indigestion due to her recent increased intake of carbonated beverages. She has started drinking more water so I have advised her that if episodes return for her to f/u in office, ED or her cardiologist. I do not feel that her CP is cardiac related at this time.    RTO 2 weeks for recheck   Tiffany A. Benjamin Stain PA-C

## 2014-07-09 LAB — URINE CULTURE

## 2014-07-21 ENCOUNTER — Ambulatory Visit: Payer: BLUE CROSS/BLUE SHIELD | Admitting: Physician Assistant

## 2014-08-25 ENCOUNTER — Ambulatory Visit: Payer: BLUE CROSS/BLUE SHIELD | Admitting: Cardiology

## 2014-09-01 ENCOUNTER — Encounter: Payer: Self-pay | Admitting: Cardiology

## 2014-10-20 ENCOUNTER — Ambulatory Visit (INDEPENDENT_AMBULATORY_CARE_PROVIDER_SITE_OTHER): Payer: BLUE CROSS/BLUE SHIELD | Admitting: Cardiology

## 2014-10-20 ENCOUNTER — Encounter: Payer: Self-pay | Admitting: Cardiology

## 2014-10-20 VITALS — BP 145/78 | HR 74 | Ht 62.0 in | Wt 134.0 lb

## 2014-10-20 DIAGNOSIS — R55 Syncope and collapse: Secondary | ICD-10-CM

## 2014-10-20 MED ORDER — ATENOLOL 50 MG PO TABS: 50.0000 mg | ORAL_TABLET | Freq: Every day | ORAL | Status: DC

## 2014-10-20 NOTE — Patient Instructions (Signed)
Medication Instructions:  The current medical regimen is effective;  continue present plan and medications.  Follow-Up: Follow up as needed.  Thank you for choosing [facility]!!     

## 2014-10-20 NOTE — Progress Notes (Signed)
   HPI The patient presents as a new patient for evaluation of syncope.  She has had an extensive history of this and a work in [place]. the workup as previously described. She's been treated with atenolol. Since I last saw her she has had no recurrent events. She's had no further syncope. She's remove some stress from her life. She seems to be feeling well. She denies any chest pressure, neck or arm discomfort. She's had no new palpitations, presyncope or syncope. She's had no new shortness of breath, PND or orthopnea.  No Known Allergies  Current Outpatient Prescriptions  Medication Sig Dispense Refill  . atenolol (TENORMIN) 50 MG tablet Take 1 tablet (50 mg total) by mouth daily. 90 tablet 3   No current facility-administered medications for this visit.    Past Medical History  Diagnosis Date  . MVP (mitral valve prolapse)   . Syncope   . Diverticulosis   . Anemia   . Anxiety   . Depression   . GERD (gastroesophageal reflux disease)     Past Surgical History  Procedure Laterality Date  . Tonsillectomy    . Induced abortion  2000     ROS: As stated in the HPI and negative for all other systems.  PHYSICAL EXAM BP 145/78 mmHg  Pulse 74  Ht 5\' 2"  (1.575 m)  Wt 134 lb (60.782 kg)  BMI 24.50 kg/m2  SpO2 100% GENERAL:  Well appearing NECK:  No jugular venous distention, waveform within normal limits, carotid upstroke brisk and symmetric, no bruits, no thyromegaly LUNGS:  Clear to auscultation bilaterally HEART:  PMI not displaced or sustained,S1 and S2 within normal limits, no S3, no S4, no clicks, no rubs, no murmurs ABD:  Flat, positive bowel sounds normal in frequency in pitch, no bruits, no rebound, no guarding, no midline pulsatile mass, no hepatomegaly, no splenomegaly EXT:  2 plus pulses throughout, no edema, no cyanosis no clubbing   ASSESSMENT AND PLAN  SYNCOPE:   The patient has had no further events. No further testing is indicated.  MVP:  She had mild prolapse  in 2015 on an outside echo. She should have this listened to clinically. She'll come back here in 3-5 years for repeat physical and possible repeat echocardiography unless she has increasing symptoms resolve are noted to have a new murmur.

## 2014-11-03 ENCOUNTER — Ambulatory Visit (INDEPENDENT_AMBULATORY_CARE_PROVIDER_SITE_OTHER): Payer: BLUE CROSS/BLUE SHIELD | Admitting: Family Medicine

## 2014-11-03 ENCOUNTER — Encounter: Payer: Self-pay | Admitting: Family Medicine

## 2014-11-03 VITALS — BP 138/87 | HR 72 | Temp 97.5°F | Ht 62.0 in | Wt 129.0 lb

## 2014-11-03 DIAGNOSIS — R059 Cough, unspecified: Secondary | ICD-10-CM

## 2014-11-03 DIAGNOSIS — J209 Acute bronchitis, unspecified: Secondary | ICD-10-CM

## 2014-11-03 DIAGNOSIS — R509 Fever, unspecified: Secondary | ICD-10-CM

## 2014-11-03 DIAGNOSIS — J329 Chronic sinusitis, unspecified: Secondary | ICD-10-CM | POA: Diagnosis not present

## 2014-11-03 DIAGNOSIS — J310 Chronic rhinitis: Secondary | ICD-10-CM

## 2014-11-03 DIAGNOSIS — R05 Cough: Secondary | ICD-10-CM

## 2014-11-03 MED ORDER — AMOXICILLIN-POT CLAVULANATE 875-125 MG PO TABS: 1.0000 | ORAL_TABLET | Freq: Two times a day (BID) | ORAL | Status: DC

## 2014-11-03 NOTE — Progress Notes (Signed)
Subjective:    Patient ID: Sandra Powell, female    DOB: 1970/11/16, 44 y.o.   MRN: [id number]  HPI Patient here today for cough, congestion, and occasional fever. She states that it started Sunday before last. She has had some head congestion and some cough. The sputum production is present but it is clear. She is having off and on fever. She does have a history of reflux has had a little bit more problems with that recently 2. She describes some body aches.      Patient Active Problem List   Diagnosis Date Noted  . Mitral valve prolapse 07/07/2014   Outpatient Encounter Prescriptions as of 11/03/2014  Medication Sig  . atenolol (TENORMIN) 50 MG tablet Take 1 tablet (50 mg total) by mouth daily.   No facility-administered encounter medications on file as of 11/03/2014.      Review of Systems  Constitutional: Positive for fever.  HENT: Positive for congestion (clear) and postnasal drip.   Eyes: Negative.   Respiratory: Positive for cough.   Cardiovascular: Negative.   Gastrointestinal: Negative.   Endocrine: Negative.   Genitourinary: Negative.   Musculoskeletal: Positive for myalgias.  Skin: Negative.   Allergic/Immunologic: Negative.   Neurological: Negative.   Hematological: Negative.   Psychiatric/Behavioral: Negative.        Objective:   Physical Exam  Constitutional: She is oriented to person, place, and time. She appears well-developed and well-nourished. She appears distressed.  HENT:  Head: Normocephalic and atraumatic.  Right Ear: External ear normal.  Left Ear: External ear normal.  Nose: Nose normal.  Mouth/Throat: Oropharynx is clear and moist.  Nasal congestion with anterior cervical tenderness on the right. Ethmoid sinus tenderness  Eyes: Conjunctivae and EOM are normal. Pupils are equal, round, and reactive to light. Right eye exhibits no discharge. Left eye exhibits no discharge. No scleral icterus.  Neck: Normal range of motion. Neck supple. No JVD  present. No thyromegaly present.  Cardiovascular: Normal rate, regular rhythm and normal heart sounds.   No murmur heard. Pulmonary/Chest: Effort normal. No respiratory distress. She has wheezes. She has no rales. She exhibits no tenderness.  Few expiratory wheezes and slight congestion with coughing  Musculoskeletal: Normal range of motion. She exhibits no edema.  Lymphadenopathy:    She has cervical adenopathy.  Neurological: She is alert and oriented to person, place, and time.  Skin: Skin is warm and dry. No rash noted.  Psychiatric: She has a normal mood and affect. Her behavior is normal. Judgment and thought content normal.  Nursing note and vitals reviewed.  BP 138/87 mmHg  Pulse 72  Temp(Src) 97.5 F (36.4 C) (Oral)  Ht 5\' 2"  (1.575 m)  Wt 129 lb (58.514 kg)  BMI 23.59 kg/m2  LMP 10/27/2014        Assessment & Plan:  1. Cough -Take Mucinex maximum strength twice daily with a large glass of water for cough and congestion - CBC with Differential/Platelet  2. Fever, unspecified fever cause -Take Tylenol for aches pains and fever - CBC with Differential/Platelet  3. Rhinosinusitis -Take Mucinex and use nasal saline frequently during the day to each nostril - amoxicillin-clavulanate (AUGMENTIN) 875-125 MG tablet; Take 1 tablet by mouth 2 (two) times daily.  Dispense: 20 tablet; Refill: 0  4. Acute bronchitis, unspecified organism -Mucinex as directed - amoxicillin-clavulanate (AUGMENTIN) 875-125 MG tablet; Take 1 tablet by mouth 2 (two) times daily.  Dispense: 20 tablet; Refill: 0  Patient Instructions  Drink plenty of fluids and  stay well hydrated Use nasal saline and take Mucinex as directed on the handout sheet Purchase Flonase over-the-counter and use 1 spray each nostril at bedtime Take antibiotic as directed Call us in 7-10 days regarding your progress You can also purchase the equate brand of Zantac 150 and take one twice daily before breakfast and  supper   Arrie Senate MD

## 2014-11-03 NOTE — Patient Instructions (Signed)
Drink plenty of fluids and stay well hydrated Use nasal saline and take Mucinex as directed on the handout sheet Purchase Flonase over-the-counter and use 1 spray each nostril at bedtime Take antibiotic as directed Call us in 7-10 days regarding your progress You can also purchase the equate brand of Zantac 150 and take one twice daily before breakfast and supper

## 2014-11-04 LAB — CBC WITH DIFFERENTIAL/PLATELET
BASOS ABS: 0 10*3/uL (ref 0.0–0.2)
Basos: 0 %
EOS (ABSOLUTE): 0.2 10*3/uL (ref 0.0–0.4)
Eos: 2 %
HEMOGLOBIN: 13.3 g/dL (ref 11.1–15.9)
Hematocrit: 40.4 % (ref 34.0–46.6)
Immature Grans (Abs): 0 10*3/uL (ref 0.0–0.1)
Immature Granulocytes: 0 %
LYMPHS ABS: 3.1 10*3/uL (ref 0.7–3.1)
LYMPHS: 34 %
MCH: 29 pg (ref 26.6–33.0)
MCHC: 32.9 g/dL (ref 31.5–35.7)
MCV: 88 fL (ref 79–97)
MONOCYTES: 11 %
Monocytes Absolute: 1 10*3/uL — ABNORMAL HIGH (ref 0.1–0.9)
NEUTROS ABS: 4.9 10*3/uL (ref 1.4–7.0)
Neutrophils: 53 %
PLATELETS: 342 10*3/uL (ref 150–379)
RBC: 4.58 x10E6/uL (ref 3.77–5.28)
RDW: 13.9 % (ref 12.3–15.4)
WBC: 9.3 10*3/uL (ref 3.4–10.8)

## 2015-03-01 ENCOUNTER — Encounter: Payer: BLUE CROSS/BLUE SHIELD | Admitting: *Deleted

## 2015-03-01 LAB — HM MAMMOGRAPHY

## 2015-03-24 ENCOUNTER — Encounter: Payer: Self-pay | Admitting: *Deleted

## 2015-05-03 ENCOUNTER — Telehealth: Payer: Self-pay | Admitting: Family Medicine

## 2015-05-03 MED ORDER — DIAZEPAM 2 MG PO TABS: ORAL_TABLET | ORAL | Status: DC

## 2015-05-03 MED ORDER — SCOPOLAMINE 1 MG/3DAYS TD PT72: 1.0000 | MEDICATED_PATCH | TRANSDERMAL | Status: DC

## 2015-05-03 NOTE — Telephone Encounter (Signed)
Rx called into pharmacy per verbal order from [facility] over the phone. Pt is aware.

## 2015-05-10 ENCOUNTER — Telehealth: Payer: Self-pay

## 2015-05-10 NOTE — Telephone Encounter (Signed)
Pt aware to try dramamine OTC - by VM

## 2015-05-10 NOTE — Telephone Encounter (Signed)
Insurance denied prior authorization for Transderm [place]   Covered med is Meclizine

## 2015-05-10 NOTE — Telephone Encounter (Signed)
If insurance is not going to pay for Transderm scopolamine, she could try Dramamine over-the-counter for nausea while on the cruise. If it will not pay for this or if she cannot afford this she could get meclizine 12.5 mg and take that regularly. 3 or 4 times daily for the meclizine.

## 2015-07-28 DIAGNOSIS — N924 Excessive bleeding in the premenopausal period: Secondary | ICD-10-CM | POA: Diagnosis not present

## 2015-07-28 DIAGNOSIS — K649 Unspecified hemorrhoids: Secondary | ICD-10-CM | POA: Diagnosis not present

## 2015-07-28 DIAGNOSIS — Z01411 Encounter for gynecological examination (general) (routine) with abnormal findings: Secondary | ICD-10-CM | POA: Diagnosis not present

## 2015-07-28 DIAGNOSIS — Z01419 Encounter for gynecological examination (general) (routine) without abnormal findings: Secondary | ICD-10-CM | POA: Diagnosis not present

## 2015-09-10 DIAGNOSIS — Z30430 Encounter for insertion of intrauterine contraceptive device: Secondary | ICD-10-CM | POA: Diagnosis not present

## 2015-10-10 ENCOUNTER — Ambulatory Visit (INDEPENDENT_AMBULATORY_CARE_PROVIDER_SITE_OTHER): Payer: BLUE CROSS/BLUE SHIELD | Admitting: Obstetrics and Gynecology

## 2015-10-10 ENCOUNTER — Encounter: Payer: Self-pay | Admitting: Obstetrics and Gynecology

## 2015-10-10 VITALS — BP 120/90 | HR 80 | Ht 62.0 in | Wt 131.0 lb

## 2015-10-10 DIAGNOSIS — Z01419 Encounter for gynecological examination (general) (routine) without abnormal findings: Secondary | ICD-10-CM | POA: Insufficient documentation

## 2015-10-10 DIAGNOSIS — Z30011 Encounter for initial prescription of contraceptive pills: Secondary | ICD-10-CM

## 2015-10-10 MED ORDER — MEDROXYPROGESTERONE ACETATE 10 MG PO TABS: 10.0000 mg | ORAL_TABLET | Freq: Every day | ORAL | 0 refills | Status: DC

## 2015-10-10 MED ORDER — NORETHINDRONE 0.35 MG PO TABS: 1.0000 | ORAL_TABLET | Freq: Every day | ORAL | 11 refills | Status: DC

## 2015-10-10 NOTE — Progress Notes (Addendum)
[facility] Visit  10/10/15            Patient name: Ninoska Dooney MRN [id number]  Date of birth: March 06, 1970  CC & HPI:  Mabrey Quaranta is a 45 y.o. female presenting today for abnormal periods. Patient states she had 2 periods "back to back" recently. Last periods was 09/22/15. She also reports having a possible internal hemorrhoid that burst a few days ago. She is also requesting resuming birth control however she reports having migraines when she used to take this 15 years ago. She denies history of DVT/PE.   She also complains of a burning area to the mid chest for the past 4 months.   She had a normal pap smear on 07/28/15 and normal mammogram in March 2017.    ROS:  ROS +abnormal periods +skin wound  Otherwise negative  Pertinent History Reviewed:   Reviewed: Significant for anemia  Medical         Past Medical History:  Diagnosis Date  . Anemia   . Anxiety   . Depression   . Diverticulosis   . GERD (gastroesophageal reflux disease)   . MVP (mitral valve prolapse)   . Syncope                               Surgical Hx:    Past Surgical History:  Procedure Laterality Date  . INDUCED ABORTION  2000  . TONSILLECTOMY     Medications: Reviewed & Updated - see associated section                       Current Outpatient Prescriptions:  .  atenolol (TENORMIN) 50 MG tablet, Take 1 tablet (50 mg total) by mouth daily., Disp: 90 tablet, Rfl: 3   Social History: Reviewed -  reports that she has never smoked. She has never used smokeless tobacco.  Objective Findings:  Vitals: Blood pressure 120/90, pulse 80, height 5\' 2"  (1.575 m), weight 131 lb (59.4 kg), last menstrual period 09/26/2015.  Physical Examination: General appearance - alert, well appearing, and in no distress and oriented to person, place, and time Mental status - alert, oriented to person, place, and time Chest wall: small 1 cm nonhealing area on mid sternum, suspicious for precancerous changes pt  aware and will see dermatologist. Pelvic -  VULVA: normal appearing vulva with no masses, tenderness or lesions,  VAGINA: normal appearing vagina with normal color and discharge, no lesions,  CERVIX: normal appearing cervix without discharge or lesions,  UTERUS: uterus is normal size, shape, consistency and nontender, well supported  ADNEXA: normal adnexa in size, nontender and no masses   Assessment & Plan:   A:  1. Normal gyn exam 2. Contraception management 3. History of chronic HA's on COC 4 chest skin lesion r/o neoplasm. P:  1. Provera 10 day course  2. Micronor to start 5 days after completing Provera 3. Follow up in 1 year 4 dermatologist to see pt given name and address Dr Nevada Crane.   By signing my name below, I, Sonum Patel, attest that this documentation has been prepared under the direction and in the presence of Jonnie Kind, MD. Electronically Signed: Sonum Patel, Education administrator. 10/10/15. 2:50 PM.  I personally performed the services described in this documentation, which was SCRIBED in my presence. The recorded information has been reviewed and considered accurate. It has been edited as necessary during review.  Jonnie Kind, MD

## 2015-10-21 ENCOUNTER — Telehealth: Payer: Self-pay | Admitting: Obstetrics and Gynecology

## 2015-10-21 NOTE — Telephone Encounter (Signed)
Pt has question about when to start Micronor. Per Dr.Ferguson OV note 10/10/2015 pt to take Provera for 10 days, wait 5 days and then start Micronor. Pt verbalized understanding.

## 2015-10-31 ENCOUNTER — Telehealth: Payer: Self-pay | Admitting: Cardiology

## 2015-10-31 ENCOUNTER — Other Ambulatory Visit: Payer: Self-pay | Admitting: Cardiology

## 2015-10-31 MED ORDER — ATENOLOL 50 MG PO TABS: 50.0000 mg | ORAL_TABLET | Freq: Every day | ORAL | 3 refills | Status: DC

## 2015-10-31 NOTE — Telephone Encounter (Signed)
Rx request sent to pharmacy.  

## 2015-10-31 NOTE — Telephone Encounter (Signed)
New Message   *STAT* If patient is at the pharmacy, call can be transferred to refill team.   1. Which medications need to be refilled? (please list name of each medication and dose if known)  Atenolol 50 mg tablet once daily  2. Which pharmacy/location (including street and city if local pharmacy) is medication to be sent to? [place], [place], [place]  3. Do they need a 30 day or 90 day supply?  90 day supply

## 2015-12-07 ENCOUNTER — Ambulatory Visit: Payer: BLUE CROSS/BLUE SHIELD | Admitting: Cardiology

## 2016-01-20 ENCOUNTER — Encounter: Payer: Self-pay | Admitting: Physician Assistant

## 2016-01-20 ENCOUNTER — Ambulatory Visit (INDEPENDENT_AMBULATORY_CARE_PROVIDER_SITE_OTHER): Payer: BLUE CROSS/BLUE SHIELD | Admitting: Physician Assistant

## 2016-01-20 VITALS — BP 121/72 | HR 68 | Temp 97.9°F | Ht 62.0 in | Wt 140.8 lb

## 2016-01-20 DIAGNOSIS — L309 Dermatitis, unspecified: Secondary | ICD-10-CM

## 2016-01-20 MED ORDER — METHYLPREDNISOLONE ACETATE 80 MG/ML IJ SUSP
80.0000 mg | Freq: Once | INTRAMUSCULAR | Status: AC
  Administered 2016-01-20: 80 mg via INTRAMUSCULAR

## 2016-01-20 MED ORDER — PREDNISONE 10 MG (21) PO TBPK: ORAL_TABLET | ORAL | 0 refills | Status: DC

## 2016-01-20 MED ORDER — DESONIDE 0.05 % EX LOTN: TOPICAL_LOTION | Freq: Two times a day (BID) | CUTANEOUS | 0 refills | Status: DC

## 2016-01-20 NOTE — Patient Instructions (Signed)
Atopic Dermatitis Atopic dermatitis is a skin disorder that causes inflammation of the skin. This is the most common type of eczema. Eczema is a group of skin conditions that cause the skin to be itchy, red, and swollen. This condition is generally worse during the cooler winter months and often improves during the warm summer months. Symptoms can vary from person to person. Atopic dermatitis usually starts showing signs in infancy and can last through adulthood. This condition cannot be passed from one person to another (non-contagious), but is more common in families. Atopic dermatitis may not always be present. When it is present, it is called a flare-up. What are the causes? The exact cause of this condition is not known. Flare-ups of the condition may be triggered by:  Contact with something you are sensitive or allergic to.  Stress.  Certain foods.  Extremely hot or cold weather.  Harsh chemicals and soaps.  Dry air.  Chlorine. What increases the risk? This condition is more likely to develop in people who have a personal history or family history of eczema, allergies, asthma, or hay fever. What are the signs or symptoms? Symptoms of this condition include:  Dry, scaly skin.  Red, itchy rash.  Itchiness, which can be severe. This may occur before the skin rash. This can make sleeping difficult.  Skin thickening and cracking can occur over time. How is this diagnosed? This condition is diagnosed based on your symptoms, a medical history, and a physical exam. How is this treated? There is no cure for this condition, but symptoms can usually be controlled. Treatment focuses on:  Controlling the itching and scratching. You may be given medicines, such as antihistamines or steroid creams.  Limiting exposure to things that you are sensitive or allergic to (allergens).  Recognizing situations that cause stress and developing a plan to manage stress. If your atopic dermatitis  does not get better with medicines or is all over your body (widespread) , a treatment using a specific type of light (phototherapy) may be used. Follow these instructions at home: Skin care  Keep your skin well-moisturized. This seals in moisture and help prevent dryness.  Use unscented lotions that have petroleum in them.  Avoid lotions that contain alcohol and water. They can dry the skin.  Keep baths or showers short (less than 5 minutes) in warm water. Do not use hot water.  Use mild, unscented cleansers for bathing. Avoid soap and bubble bath.  Apply a moisturizer to your skin right after a bath or shower.   Do not apply anything to your skin without checking with your health care provider. General instructions  Dress in clothes made of cotton or cotton blends. Dress lightly because heat increases itching.  When washing your clothes, rinse your clothes twice so all of the soap is removed.  Avoid any triggers that can cause a flare-up.  Try to manage your stress.  Keep your fingernails cut short.  Avoid scratching. Scratching makes the rash and itching worse. It may also result in a skin infection (impetigo) due to a break in the skin caused by scratching.  Take or apply over-the-counter and prescription medicines only as told by your health care provider.  Keep all follow-up visits as told by your health care provider. This is important.  Do not be around people who have cold sores or fever blisters. If you get the infection, it may cause your atopic dermatitis to worsen. Contact a health care provider if:  Your itching  interferes with sleep.  Your rash gets worse or is not better within one week of starting treatment.  You have a fever.  You have a rash flare-up after having contact with someone who has cold sores or fever blisters. Get help right away if:  You develop pus or soft yellow scabs in the rash area. Summary  This condition causes a red rash and  itchy, dry, scaly skin.  Treatment focuses on controlling the itching and scratching, limiting exposure to things that you are sensitive or allergic to (allergens), and recognizing situations that cause stress and developing a plan to manage stress.  Keep your skin well-moisturized.  Keep baths or showers less than 5 minutes. This information is not intended to replace advice given to you by your health care provider. Make sure you discuss any questions you have with your health care provider. Document Released: 12/16/1999 Document Revised: 05/26/2015 Document Reviewed: 07/21/2012 Elsevier Interactive Patient Education  2017 Elsevier Inc.  

## 2016-01-22 NOTE — Progress Notes (Signed)
BP 121/72   Pulse 68   Temp 97.9 F (36.6 C) (Oral)   Ht 5\' 2"  (1.575 m)   Wt 140 lb 12.8 oz (63.9 kg)   BMI 25.75 kg/m    Subjective:    Patient ID: Sandra Powell, female    DOB: 11/02/1970, 46 y.o.   MRN: [id number]  HPI: Sandra Powell is a 46 y.o. female presenting on 01/20/2016 for Rash (right side of face)  Patient has had a rash on her lower right cheek. She has had this occur couple times over the past 2 years. It has happened during winter each time. She does not know of any specific exposure she's had. She does get pustules without breaks out. Does have significant itching and redness to it. There are no changed products that she has been using.  Past Medical History:  Diagnosis Date  . Anemia   . Anxiety   . Depression   . Diverticulosis   . GERD (gastroesophageal reflux disease)   . MVP (mitral valve prolapse)   . Syncope    Relevant past medical, surgical, family and social history reviewed and updated as indicated. Interim medical history since our last visit reviewed. Allergies and medications reviewed and updated. DATA REVIEWED: CHART IN EPIC  Social History   Social History  . Marital status: Single    Spouse name: N/A  . Number of children: 0  . Years of education: N/A   Occupational History  . Cleaner    Social History Main Topics  . Smoking status: Never Smoker  . Smokeless tobacco: Never Used  . Alcohol use Yes     Comment: Occassionally  . Drug use: Yes    Types: Marijuana  . Sexual activity: Yes    Birth control/ protection: None   Other Topics Concern  . Not on file   Social History Narrative   Lives alone    Past Surgical History:  Procedure Laterality Date  . INDUCED ABORTION  2000  . TONSILLECTOMY      Family History  Problem Relation Age of Onset  . Syncope episode Sister   . Syncope episode Father   . Mitral valve prolapse Sister   . Heart failure Sister 30    Half sister  . Heart failure  24    Nephew (sudden  death).    . Uterine cancer Mother   . Heart disease Sister     Half sister  . Colon cancer Neg Hx   . Esophageal cancer Neg Hx   . Rectal cancer Neg Hx   . Stomach cancer Neg Hx     Review of Systems  Constitutional: Negative.  Negative for activity change, fatigue and fever.  HENT: Negative.   Eyes: Negative.   Respiratory: Negative.  Negative for cough.   Cardiovascular: Negative.  Negative for chest pain.  Gastrointestinal: Negative.  Negative for abdominal pain.  Endocrine: Negative.   Genitourinary: Negative.  Negative for dysuria.  Musculoskeletal: Negative.   Skin: Positive for color change and rash.  Neurological: Negative.     Allergies as of 01/20/2016   No Known Allergies     Medication List       Accurate as of 01/20/16 11:59 PM. Always use your most recent med list.          atenolol 50 MG tablet Commonly known as:  TENORMIN Take 1 tablet (50 mg total) by mouth daily.   desonide 0.05 % lotion Commonly known as:  DESOWEN Apply topically  2 (two) times daily.   predniSONE 10 MG (21) Tbpk tablet Commonly known as:  STERAPRED UNI-PAK 21 TAB As directed x 6 days          Objective:    BP 121/72   Pulse 68   Temp 97.9 F (36.6 C) (Oral)   Ht 5\' 2"  (1.575 m)   Wt 140 lb 12.8 oz (63.9 kg)   BMI 25.75 kg/m   No Known Allergies  Wt Readings from Last 3 Encounters:  01/20/16 140 lb 12.8 oz (63.9 kg)  10/10/15 131 lb (59.4 kg)  11/03/14 129 lb (58.5 kg)    Physical Exam  Constitutional: She is oriented to person, place, and time. She appears well-developed and well-nourished.  HENT:  Head: Normocephalic and atraumatic.  Eyes: Conjunctivae and EOM are normal. Pupils are equal, round, and reactive to light.  Cardiovascular: Normal rate, regular rhythm, normal heart sounds and intact distal pulses.   Pulmonary/Chest: Effort normal and breath sounds normal.  Abdominal: Soft. Bowel sounds are normal.  Neurological: She is alert and oriented to  person, place, and time. She has normal reflexes.  Skin: Skin is warm and dry. Rash noted. Rash is maculopapular. There is erythema.     Psychiatric: She has a normal mood and affect. Her behavior is normal. Judgment and thought content normal.        Assessment & Plan:   1. Eczema of face - methylPREDNISolone acetate (DEPO-MEDROL) injection 80 mg; Inject 1 mL (80 mg total) into the muscle once. - predniSONE (STERAPRED UNI-PAK 21 TAB) 10 MG (21) TBPK tablet; As directed x 6 days  Dispense: 21 tablet; Refill: 0   Continue all other maintenance medications as listed above.  Follow up plan: Return if symptoms worsen or fail to improve.  No orders of the defined types were placed in this encounter.   Educational handout given for Norton PA-C [place] [address]  [place], [place] [address] [phone]   01/22/2016, 10:10 PM

## 2016-01-24 NOTE — Progress Notes (Signed)
   HPI The patient presents for evaluation of MVP.  She has a previous history of syncope.  She an extensive work up in [place].  She returns for follow up.  Since I last saw her she has done well.  The patient denies any new symptoms such as chest discomfort, neck or arm discomfort. There has been no new shortness of breath, PND or orthopnea. There have been no reported palpitations, presyncope or syncope.     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  She had palpitations and could not tolerate this.  She had had this problem in the past as well.  Since this med has stopped she has felt well.     No Known Allergies  Current Outpatient Prescriptions  Medication Sig Dispense Refill  . atenolol (TENORMIN) 50 MG tablet Take 1 tablet (50 mg total) by mouth daily. 90 tablet 3  . predniSONE (STERAPRED UNI-PAK 21 TAB) 10 MG (21) TBPK tablet As directed x 6 days 21 tablet 0  . desonide (DESOWEN) 0.05 % lotion Apply topically 2 (two) times daily. (Patient not taking: Reported on 01/25/2016) 59 mL 0   No current facility-administered medications for this visit.     Past Medical History:  Diagnosis Date  . Anemia   . Anxiety   . Depression   . Diverticulosis   . GERD (gastroesophageal reflux disease)   . MVP (mitral valve prolapse)   . Syncope     Past Surgical History:  Procedure Laterality Date  . INDUCED ABORTION  2000  . TONSILLECTOMY       ROS:    As stated in the HPI and negative for all other systems.  PHYSICAL EXAM BP 121/78   Pulse 66   Ht 5\' 2"  (1.575 m)   Wt 141 lb (64 kg)   BMI 25.79 kg/m  GENERAL:  Well appearing NECK:  No jugular venous distention, waveform within normal limits, carotid upstroke brisk and symmetric, no bruits, no thyromegaly LUNGS:  Clear to auscultation bilaterally HEART:  PMI not displaced or sustained,S1 and S2 within normal limits, no S3, no S4, no clicks, no rubs, no murmurs ABD:   Flat, positive bowel sounds normal in frequency in pitch, no bruits, no rebound, no guarding, no midline pulsatile mass, no hepatomegaly, no splenomegaly EXT:  2 plus pulses throughout, no edema, no cyanosis no clubbing  EKG:  Slightly unusual P-wave axis fat ectopic atrial rhythm, axis within normal limits, intervals within normal limits, rate 64, no acute ST-T wave changes. 01/26/2016  ASSESSMENT AND PLAN  SYNCOPE:   The patient has had no further events. No further testing is indicated.  MVP:  She has no recurrent symptoms and no change in her exam.  She can remain on the beta blocker.  No further imaging is indicated at this point.

## 2016-01-25 ENCOUNTER — Ambulatory Visit (INDEPENDENT_AMBULATORY_CARE_PROVIDER_SITE_OTHER): Payer: BLUE CROSS/BLUE SHIELD | Admitting: Cardiology

## 2016-01-25 ENCOUNTER — Encounter: Payer: Self-pay | Admitting: Cardiology

## 2016-01-25 VITALS — BP 121/78 | HR 66 | Ht 62.0 in | Wt 141.0 lb

## 2016-01-25 DIAGNOSIS — I341 Nonrheumatic mitral (valve) prolapse: Secondary | ICD-10-CM

## 2016-01-25 DIAGNOSIS — R55 Syncope and collapse: Secondary | ICD-10-CM

## 2016-01-25 NOTE — Patient Instructions (Signed)
Medication Instructions:  The current medical regimen is effective;  continue present plan and medications.  Follow-Up: Follow up in 18 months with Dr. Percival Spanish.  You will receive a letter in the mail 2 months before you are due.  Please call us when you receive this letter to schedule your follow up appointment.  If you need a refill on your cardiac medications before your next appointment, please call your pharmacy.  Thank you for choosing [facility]!!    \

## 2016-01-26 ENCOUNTER — Encounter: Payer: Self-pay | Admitting: Cardiology

## 2016-01-31 ENCOUNTER — Telehealth: Payer: Self-pay

## 2016-01-31 MED ORDER — DESONIDE 0.05 % EX CREA: TOPICAL_CREAM | Freq: Two times a day (BID) | CUTANEOUS | 0 refills | Status: DC

## 2016-01-31 NOTE — Telephone Encounter (Signed)
Prescription sent to pharmacy.

## 2016-03-08 ENCOUNTER — Ambulatory Visit: Payer: BLUE CROSS/BLUE SHIELD | Admitting: Obstetrics and Gynecology

## 2016-03-14 ENCOUNTER — Encounter: Payer: Self-pay | Admitting: Obstetrics and Gynecology

## 2016-03-14 ENCOUNTER — Ambulatory Visit (INDEPENDENT_AMBULATORY_CARE_PROVIDER_SITE_OTHER): Payer: BLUE CROSS/BLUE SHIELD | Admitting: Obstetrics and Gynecology

## 2016-03-14 VITALS — BP 126/84 | HR 66 | Wt 140.6 lb

## 2016-03-14 DIAGNOSIS — N938 Other specified abnormal uterine and vaginal bleeding: Secondary | ICD-10-CM | POA: Diagnosis not present

## 2016-03-14 DIAGNOSIS — Z118 Encounter for screening for other infectious and parasitic diseases: Secondary | ICD-10-CM | POA: Diagnosis not present

## 2016-03-14 DIAGNOSIS — Z862 Personal history of diseases of the blood and blood-forming organs and certain disorders involving the immune mechanism: Secondary | ICD-10-CM

## 2016-03-14 DIAGNOSIS — N926 Irregular menstruation, unspecified: Secondary | ICD-10-CM

## 2016-03-14 DIAGNOSIS — N920 Excessive and frequent menstruation with regular cycle: Secondary | ICD-10-CM | POA: Diagnosis not present

## 2016-03-14 DIAGNOSIS — Z1159 Encounter for screening for other viral diseases: Secondary | ICD-10-CM | POA: Diagnosis not present

## 2016-03-14 NOTE — Progress Notes (Signed)
   [facility] Visit  03/14/16           Patient name: Sandra Powell MRN [id number]  Date of birth: 02-Nov-1970  CC & HPI:  Guerline Happ is a 46 y.o. female presenting today for irregular vaginal bleeding. She states that she has had 6 menstrual cycles "back to back" since 09/2015. Pt reports associated lower abdominal cramping. Pt has tried provera and OCP with mild relief of her symptoms. Denies breakthrough bleeding and any other symptoms. Denies recent sexual activity. Denies having any children at this time.     ROS:  ROS +irregular vaginal bleeding  Pertinent History Reviewed:   Reviewed: Significant for anemia Medical         Past Medical History:  Diagnosis Date  . Anemia   . Anxiety   . Depression   . Diverticulosis   . GERD (gastroesophageal reflux disease)   . MVP (mitral valve prolapse)   . Syncope                               Surgical Hx:    Past Surgical History:  Procedure Laterality Date  . INDUCED ABORTION  2000  . TONSILLECTOMY     Medications: Reviewed & Updated - see associated section                       Current Outpatient Prescriptions:  .  atenolol (TENORMIN) 50 MG tablet, Take 1 tablet (50 mg total) by mouth daily., Disp: 90 tablet, Rfl: 3   Social History: Reviewed -  reports that she has never smoked. She has never used smokeless tobacco.  Objective Findings:  Vitals: Blood pressure 126/84, pulse 66, weight 140 lb 9.6 oz (63.8 kg), last menstrual period 03/09/2016.  Physical Examination: Pelvic - normal external genitalia, vulva, vagina, cervix, uterus and adnexa,  VULVA: normal appearing vulva with no masses, tenderness or lesions,  VAGINA: normal appearing vagina with normal color and discharge, no lesions,  CERVIX: normal appearing cervix without discharge or lesions, well supported, mobile, WET MOUNT done - results: negative for pathogens, normal epithelial cells,  UTERUS: uterus is normal size, shape, consistency and nontender,   ADNEXA: normal adnexa in size, nontender and no masses  Assessment & Plan:   A:  1. Heavy and frequent menses  P:  1. GC/Chlamydia probe, HIV antibody, RPR, CBC, and TSH completed today 2. Follow up for pelvic US in 1 week   By signing my name below, I, Soijett Blue, attest that this documentation has been prepared under the direction and in the presence of Jonnie Kind, MD. Electronically Signed: Warner, ED Scribe. 03/14/16. 4:12 PM.  I personally performed the services described in this documentation, which was SCRIBED in my presence. The recorded information has been reviewed and considered accurate. It has been edited as necessary during review. Jonnie Kind, MD

## 2016-03-15 LAB — RPR: RPR: NONREACTIVE

## 2016-03-15 LAB — CBC
HEMATOCRIT: 39 % (ref 34.0–46.6)
Hemoglobin: 13.1 g/dL (ref 11.1–15.9)
MCH: 29.3 pg (ref 26.6–33.0)
MCHC: 33.6 g/dL (ref 31.5–35.7)
MCV: 87 fL (ref 79–97)
Platelets: 286 10*3/uL (ref 150–379)
RBC: 4.47 x10E6/uL (ref 3.77–5.28)
RDW: 14.5 % (ref 12.3–15.4)
WBC: 8.6 10*3/uL (ref 3.4–10.8)

## 2016-03-15 LAB — TSH: TSH: 5.57 u[IU]/mL — ABNORMAL HIGH (ref 0.450–4.500)

## 2016-03-15 LAB — HIV ANTIBODY (ROUTINE TESTING W REFLEX): HIV Screen 4th Generation wRfx: NONREACTIVE

## 2016-03-16 DIAGNOSIS — Z118 Encounter for screening for other infectious and parasitic diseases: Secondary | ICD-10-CM

## 2016-03-16 DIAGNOSIS — N926 Irregular menstruation, unspecified: Secondary | ICD-10-CM | POA: Insufficient documentation

## 2016-03-16 DIAGNOSIS — Z1159 Encounter for screening for other viral diseases: Secondary | ICD-10-CM | POA: Insufficient documentation

## 2016-03-16 LAB — GC/CHLAMYDIA PROBE AMP
Chlamydia trachomatis, NAA: NEGATIVE
Neisseria gonorrhoeae by PCR: NEGATIVE

## 2016-03-19 ENCOUNTER — Other Ambulatory Visit: Payer: Self-pay | Admitting: Obstetrics and Gynecology

## 2016-03-19 DIAGNOSIS — N921 Excessive and frequent menstruation with irregular cycle: Secondary | ICD-10-CM

## 2016-03-21 ENCOUNTER — Ambulatory Visit (INDEPENDENT_AMBULATORY_CARE_PROVIDER_SITE_OTHER): Payer: BLUE CROSS/BLUE SHIELD

## 2016-03-21 DIAGNOSIS — N921 Excessive and frequent menstruation with irregular cycle: Secondary | ICD-10-CM | POA: Diagnosis not present

## 2016-03-21 NOTE — Progress Notes (Signed)
PELVIC US TA/TV: homogeneous anteverted uterus,wnl,EEC 10.1 mm,normal ovaries bilat,no free fluid,ovaries appear mobile,no pain during ultrasound

## 2016-04-09 ENCOUNTER — Ambulatory Visit (INDEPENDENT_AMBULATORY_CARE_PROVIDER_SITE_OTHER): Payer: BLUE CROSS/BLUE SHIELD | Admitting: Obstetrics and Gynecology

## 2016-04-09 ENCOUNTER — Encounter: Payer: Self-pay | Admitting: Obstetrics and Gynecology

## 2016-04-09 VITALS — BP 140/80 | HR 73 | Ht 62.0 in | Wt 139.2 lb

## 2016-04-09 DIAGNOSIS — E039 Hypothyroidism, unspecified: Secondary | ICD-10-CM

## 2016-04-09 DIAGNOSIS — N939 Abnormal uterine and vaginal bleeding, unspecified: Secondary | ICD-10-CM

## 2016-04-09 MED ORDER — LEVOTHYROXINE SODIUM 50 MCG PO TABS: 50.0000 ug | ORAL_TABLET | Freq: Every day | ORAL | 6 refills | Status: DC

## 2016-04-09 MED ORDER — MEGESTROL ACETATE 40 MG PO TABS: 40.0000 mg | ORAL_TABLET | Freq: Three times a day (TID) | ORAL | 2 refills | Status: DC

## 2016-04-09 NOTE — Progress Notes (Signed)
Patient ID: Sandra Powell, female   DOB: [birth year], 46 y.o.   MRN: [id number]   [facility] Visit  @DATE @            Patient name: Sandra Powell MRN [id number]  Date of birth: 1970-06-08  CC & HPI:   Chief Complaint  Patient presents with  . stomach cramps and bleeding continues    bleeding stopped yesterday; ? bowel related     Sandra Powell is a 46 y.o. female presenting today for f/u of AUB. Pt was last seen in the office on 03/14/16 for the issue, at which time pelvic/transvaginal US was scheduled. TSH was 5.570. Pt states following this visit, she had another period with heavy bleeding and severe abdominal cramping. She notes she was unable to differentiate some of the cramping from her bowel cramping d/t diverticulosis. Per pt, she experiences abdominal distension with her cramping.   ROS:  ROS +heavy and frequent periods  +abdominal cramping   Pertinent History Reviewed:   Reviewed Medical         Past Medical History:  Diagnosis Date  . Anemia   . Anxiety   . Depression   . Diverticulosis   . GERD (gastroesophageal reflux disease)   . MVP (mitral valve prolapse)   . Syncope                               Surgical Hx:    Past Surgical History:  Procedure Laterality Date  . INDUCED ABORTION  2000  . TONSILLECTOMY     Medications: Reviewed & Updated - see associated section                       Current Outpatient Prescriptions:  .  atenolol (TENORMIN) 50 MG tablet, Take 1 tablet (50 mg total) by mouth daily., Disp: 90 tablet, Rfl: 3   Social History: Reviewed -  reports that she has never smoked. She has never used smokeless tobacco.  Objective Findings:  Vitals: Blood pressure 140/80, pulse 73, height 5\' 2"  (1.575 m), weight 139 lb 3.2 oz (63.1 kg).  Physical Examination: Discussion only    Assessment & Plan:   A:  1. AUB 2. Hypothyroid- TSH 5.570   P:  1. Rx Synthroid and Megace  2. Repeat TSH in one month  3. F/u in 3 months for reevaluation      By signing my name below, I, Hansel Feinstein, attest that this documentation has been prepared under the direction and in the presence of Jonnie Kind, MD. Electronically Signed: Hansel Feinstein, ED Scribe. 04/09/16. 1:48 PM.  I personally performed the services described in this documentation, which was SCRIBED in my presence. The recorded information has been reviewed and considered accurate. It has been edited as necessary during review. Jonnie Kind, MD

## 2016-04-11 ENCOUNTER — Telehealth: Payer: Self-pay | Admitting: *Deleted

## 2016-04-11 NOTE — Telephone Encounter (Signed)
Patient states she was prescribed Megace but has now stopped bleeding before she has taken it.  Wants to know if she should take megace anyway. Advised patient to wait to see if she bleeds again and to start taking it then if necessary. Pt verbalized understanding.

## 2016-05-02 ENCOUNTER — Ambulatory Visit (HOSPITAL_COMMUNITY)
Admission: RE | Admit: 2016-05-02 | Discharge: 2016-05-02 | Disposition: A | Discharge status: Home / Self Care | Payer: BLUE CROSS/BLUE SHIELD | Source: Ambulatory Visit | Attending: Family Medicine | Admitting: Family Medicine

## 2016-05-02 ENCOUNTER — Ambulatory Visit: Payer: BLUE CROSS/BLUE SHIELD | Admitting: Family Medicine

## 2016-05-02 ENCOUNTER — Telehealth: Payer: Self-pay | Admitting: Family Medicine

## 2016-05-02 ENCOUNTER — Ambulatory Visit (INDEPENDENT_AMBULATORY_CARE_PROVIDER_SITE_OTHER): Payer: BLUE CROSS/BLUE SHIELD | Admitting: Family Medicine

## 2016-05-02 ENCOUNTER — Encounter: Payer: Self-pay | Admitting: Family Medicine

## 2016-05-02 VITALS — BP 122/85 | HR 77 | Temp 99.3°F | Ht 62.0 in | Wt 138.0 lb

## 2016-05-02 DIAGNOSIS — R1030 Lower abdominal pain, unspecified: Secondary | ICD-10-CM

## 2016-05-02 DIAGNOSIS — K5730 Diverticulosis of large intestine without perforation or abscess without bleeding: Secondary | ICD-10-CM | POA: Diagnosis not present

## 2016-05-02 DIAGNOSIS — R102 Pelvic and perineal pain: Secondary | ICD-10-CM | POA: Diagnosis not present

## 2016-05-02 LAB — MICROSCOPIC EXAMINATION: Renal Epithel, UA: NONE SEEN /hpf

## 2016-05-02 LAB — URINALYSIS, COMPLETE
Bilirubin, UA: NEGATIVE
Glucose, UA: NEGATIVE
Ketones, UA: NEGATIVE
Leukocytes, UA: NEGATIVE
Nitrite, UA: NEGATIVE
UUROB: 0.2 mg/dL (ref 0.2–1.0)
pH, UA: 6 (ref 5.0–7.5)

## 2016-05-02 LAB — PREGNANCY, URINE: PREG TEST UR: NEGATIVE

## 2016-05-02 MED ORDER — METRONIDAZOLE 500 MG PO TABS: 500.0000 mg | ORAL_TABLET | Freq: Two times a day (BID) | ORAL | 0 refills | Status: DC

## 2016-05-02 MED ORDER — CIPROFLOXACIN HCL 500 MG PO TABS: 500.0000 mg | ORAL_TABLET | Freq: Two times a day (BID) | ORAL | 0 refills | Status: DC

## 2016-05-02 MED ORDER — IOPAMIDOL (ISOVUE-300) INJECTION 61%
INTRAVENOUS | Status: AC
  Filled 2016-05-02: qty 30

## 2016-05-02 MED ORDER — IOPAMIDOL (ISOVUE-300) INJECTION 61%
100.0000 mL | Freq: Once | INTRAVENOUS | Status: AC | PRN
  Administered 2016-05-02: 100 mL via INTRAVENOUS

## 2016-05-02 NOTE — Telephone Encounter (Signed)
[facility] radiology call report of abdominal and pelvic results; no acute problem but has colonic diverticulosis without diverticulitis.

## 2016-05-02 NOTE — Progress Notes (Signed)
BP 122/85   Pulse 77   Temp 99.3 F (37.4 C) (Oral)   Ht 5\' 2"  (1.575 m)   Wt 138 lb (62.6 kg)   BMI 25.24 kg/m    Subjective:    Patient ID: Sandra Powell, female    DOB: [birth year], 46 y.o.   MRN: [id number]  HPI: Sandra Powell is a 46 y.o. 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  Patient developed fever this morning)   HPI Abdominal pain and cramping Patient has been having abdominal pain and cramping that has been worsening over the past week. She usually gets very heavy periods and it started out like her normal periods but now the pain is much worse than what it usually gets. She says the pain is worse in the left lower part of her abdomen but she also gets it in the right lower part of her abdomen as well. She denies any dysuria or blood in her urine. She denies any constipation or diarrhea or blood in her stool. She has had fevers and clamminess and sweats starting just today and here in the office her temperature is 99.3. She did not take her temperature at home. She denies any sick contacts that she knows of. She denies any pain radiating into her flank or anywhere else. She did start taking Megace to help stop her periods which is from her obstetrician Dr. Glo Herring but it has not helped. Her period or stop the cramping.  Relevant past medical, surgical, family and social history reviewed and updated as indicated. Interim medical history since our last visit reviewed. Allergies and medications reviewed and updated.  Review of Systems  Constitutional: Positive for fever. Negative for chills.  Respiratory: Negative for chest tightness and shortness of breath.   Cardiovascular: Negative for chest pain and leg swelling.    Gastrointestinal: Positive for abdominal pain. Negative for blood in stool, constipation, diarrhea, nausea and vomiting.  Genitourinary: Positive for menstrual problem. Negative for difficulty urinating, dysuria, flank pain, frequency, hematuria and vaginal discharge.  Musculoskeletal: Positive for back pain. Negative for gait problem.  Skin: Negative for color change and rash.  Neurological: Negative for light-headedness and headaches.  Psychiatric/Behavioral: Negative for agitation and behavioral problems.  All other systems reviewed and are negative.   Per HPI unless specifically indicated above      Objective:    BP 122/85   Pulse 77   Temp 99.3 F (37.4 C) (Oral)   Ht 5\' 2"  (1.575 m)   Wt 138 lb (62.6 kg)   BMI 25.24 kg/m   Wt Readings from Last 3 Encounters:  05/02/16 138 lb (62.6 kg)  04/09/16 139 lb 3.2 oz (63.1 kg)  03/14/16 140 lb 9.6 oz (63.8 kg)    Physical Exam  Constitutional: She is oriented to person, place, and time. She appears well-developed and well-nourished. No distress.  Eyes: Conjunctivae are normal.  Cardiovascular: Normal rate, regular rhythm, normal heart sounds and intact distal pulses.   No murmur heard. Pulmonary/Chest: Effort normal and breath sounds normal. No respiratory distress. She has no wheezes.  Abdominal: Soft. Bowel sounds are normal. She exhibits no distension. There is no hepatosplenomegaly. There is tenderness in the right lower quadrant and left lower quadrant. There is no rigidity,  no rebound, no guarding and no CVA tenderness. No hernia.  Musculoskeletal: Normal range of motion.  Neurological: She is alert and oriented to person, place, and time. Coordination normal.  Skin: Skin is warm and dry. No rash noted. She is not diaphoretic.  Psychiatric: She has a normal mood and affect. Her behavior is normal.  Nursing note and vitals reviewed.  Urinalysis: 0-5 WBCs, 11-30 RBCs, 0-10 of diffuse cells, mucus present, bacteria  moderate  Patient is currently on her menstrual cycle    Assessment & Plan:   Problem List Items Addressed This Visit    None    Visit Diagnoses    Lower abdominal pain    -  Primary   Worsening left lower but also has right lower   Relevant Orders   Urinalysis, Complete (Completed)   CBC with Differential/Platelet   Pregnancy, urine (Completed)   CT Abdomen Pelvis W Contrast      Follow up plan: Return if symptoms worsen or fail to improve.  Counseling provided for all of the vaccine components Orders Placed This Encounter  Procedures  . Urinalysis, Complete  . CBC with Differential/Platelet  . Pregnancy, urine    Caryl Pina, MD [facility] 05/02/2016, 10:44 AM

## 2016-05-03 LAB — CBC WITH DIFFERENTIAL/PLATELET
BASOS: 1 %
Basophils Absolute: 0 10*3/uL (ref 0.0–0.2)
EOS (ABSOLUTE): 0.2 10*3/uL (ref 0.0–0.4)
EOS: 3 %
HEMATOCRIT: 41.2 % (ref 34.0–46.6)
Hemoglobin: 13.2 g/dL (ref 11.1–15.9)
IMMATURE GRANS (ABS): 0 10*3/uL (ref 0.0–0.1)
IMMATURE GRANULOCYTES: 0 %
LYMPHS: 35 %
Lymphocytes Absolute: 2.6 10*3/uL (ref 0.7–3.1)
MCH: 29.1 pg (ref 26.6–33.0)
MCHC: 32 g/dL (ref 31.5–35.7)
MCV: 91 fL (ref 79–97)
MONOS ABS: 0.9 10*3/uL (ref 0.1–0.9)
Monocytes: 12 %
NEUTROS ABS: 3.7 10*3/uL (ref 1.4–7.0)
NEUTROS PCT: 49 %
Platelets: 263 10*3/uL (ref 150–379)
RBC: 4.54 x10E6/uL (ref 3.77–5.28)
RDW: 13.2 % (ref 12.3–15.4)
WBC: 7.5 10*3/uL (ref 3.4–10.8)

## 2016-05-09 IMAGING — CT CT ABD-PELV W/ CM
2 of 5 series · 16 of 46 positions shown, 18 images · IV contrast (Omnipaque 300)
Comparison: None.

CLINICAL DATA: Left lower quadrant abdominal pain

EXAM:
CT ABDOMEN AND PELVIS WITH CONTRAST
TECHNIQUE: Multidetector CT imaging of the abdomen and pelvis was performed
using the standard protocol following bolus administration of
intravenous contrast.
CONTRAST:  50mL OMNIPAQUE IOHEXOL 300 MG/ML SOLN, 100mL OMNIPAQUE
IOHEXOL 300 MG/ML SOLN

[Series 2: abd_pel_with 5.0 b40f · axial · 0.67mm/px · z∈[-450,-85]mm · 13 of 83 slices shown, 15 images]
[im 5/83  soft-tissue]
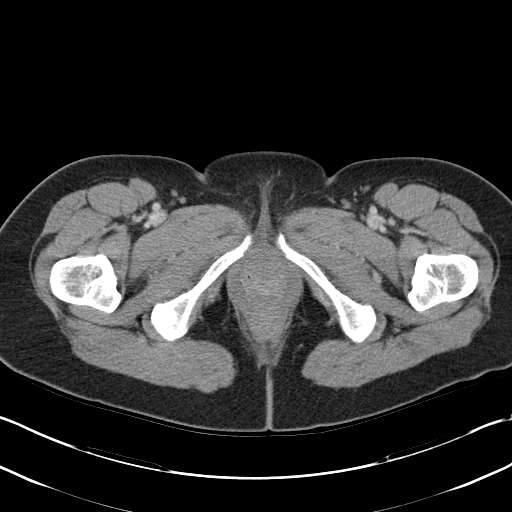
[im 5/83  bone]
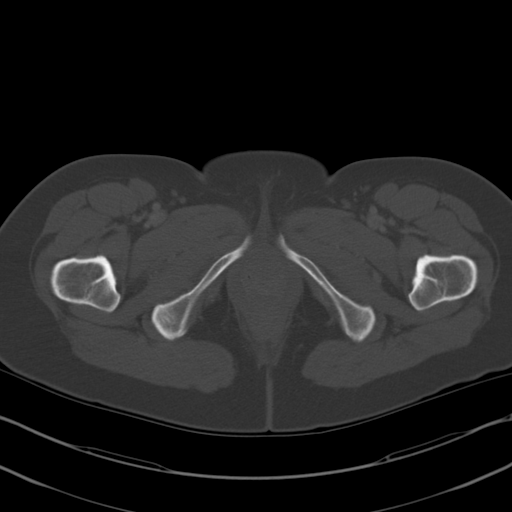
[im 10/83  soft-tissue]
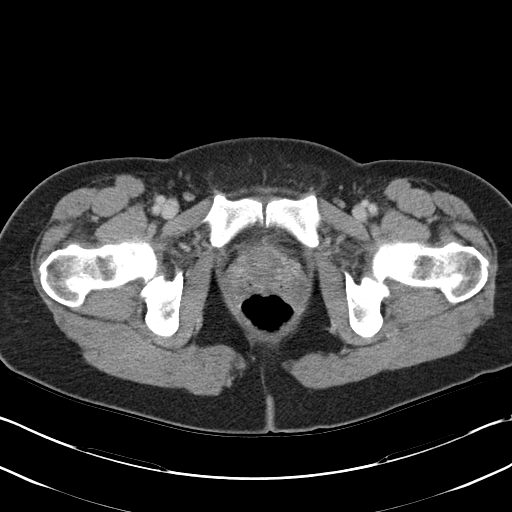
[im 19/83  soft-tissue]
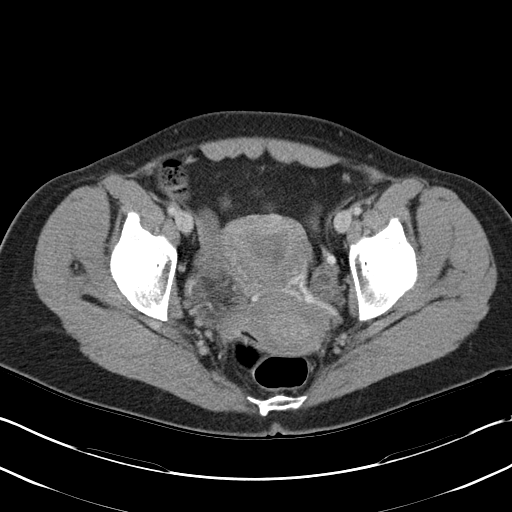
[im 23/83  soft-tissue]
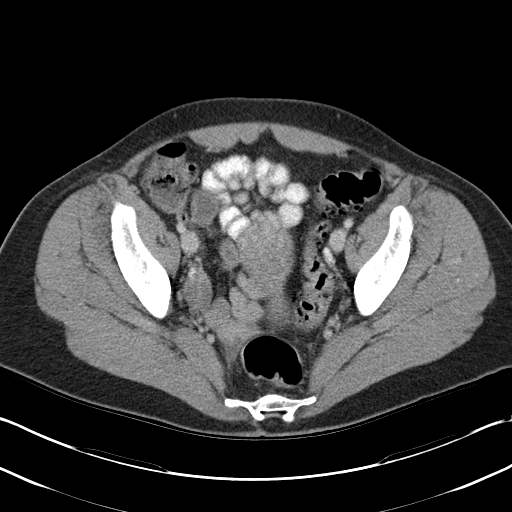
[im 28/83  soft-tissue]
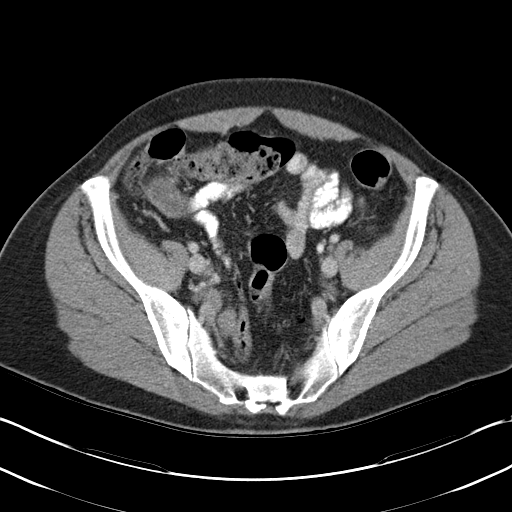
[im 37/83  soft-tissue]
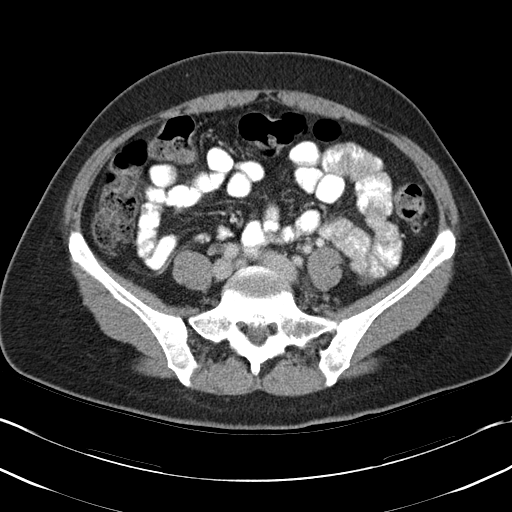
[im 42/83  soft-tissue]
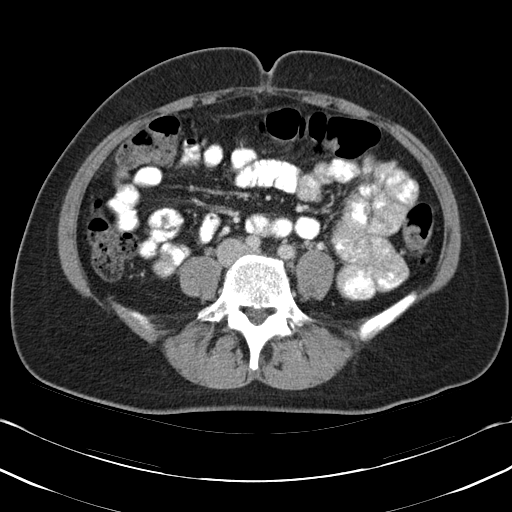
[im 46/83  soft-tissue]
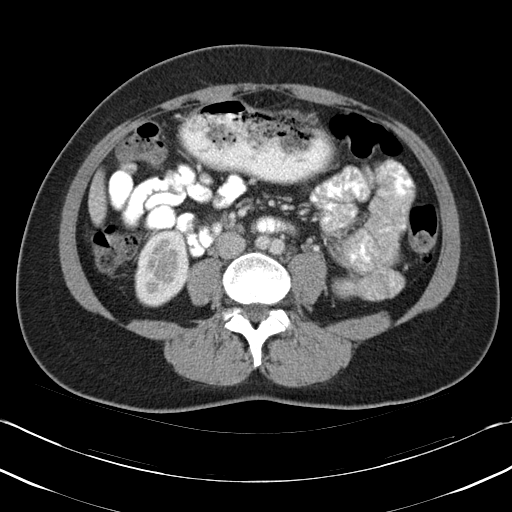
[im 55/83  soft-tissue]
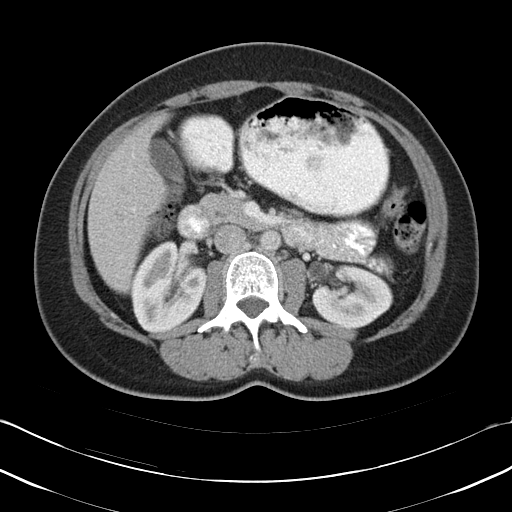
[im 55/83  bone]
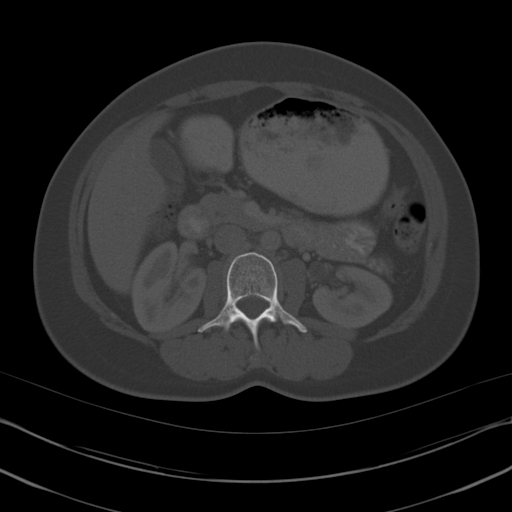
[im 60/83  soft-tissue]
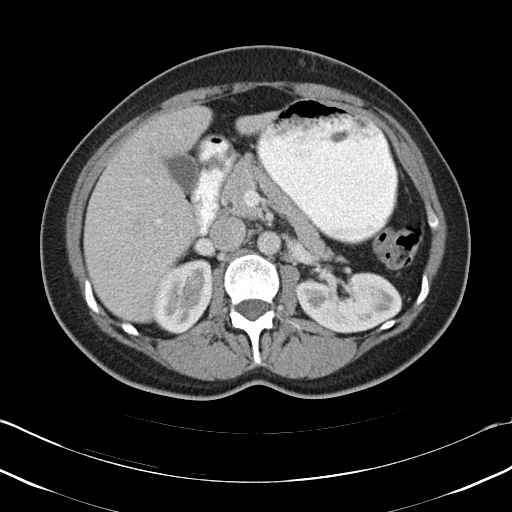
[im 64/83  soft-tissue]
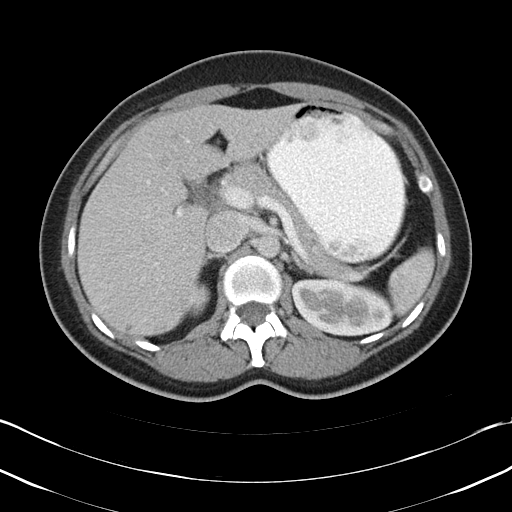
[im 73/83  soft-tissue]
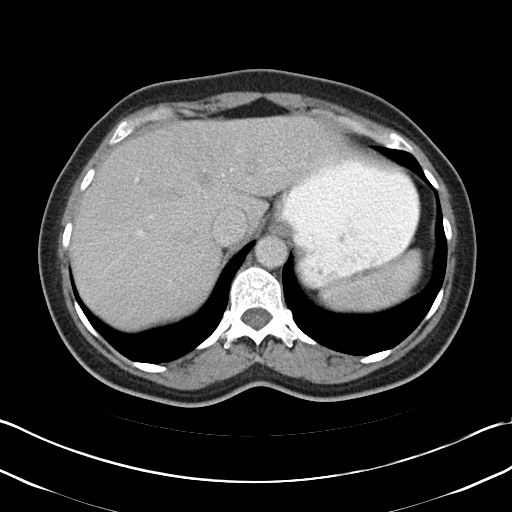
[im 78/83  soft-tissue]
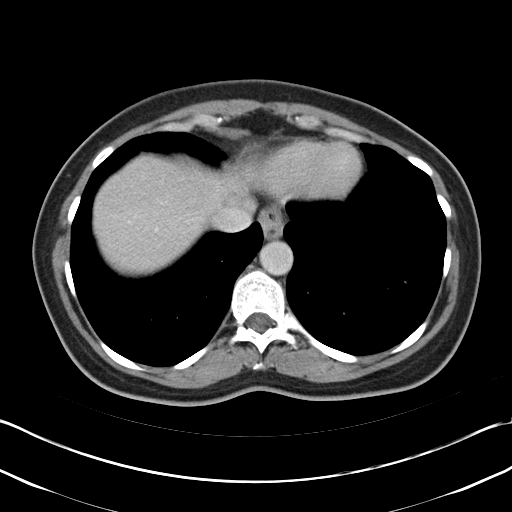

[Series 5: abd_pel_with 3.0 spo · coronal · 0.59mm/px · 3 of 83 slices shown]
[im 28/83  soft-tissue]
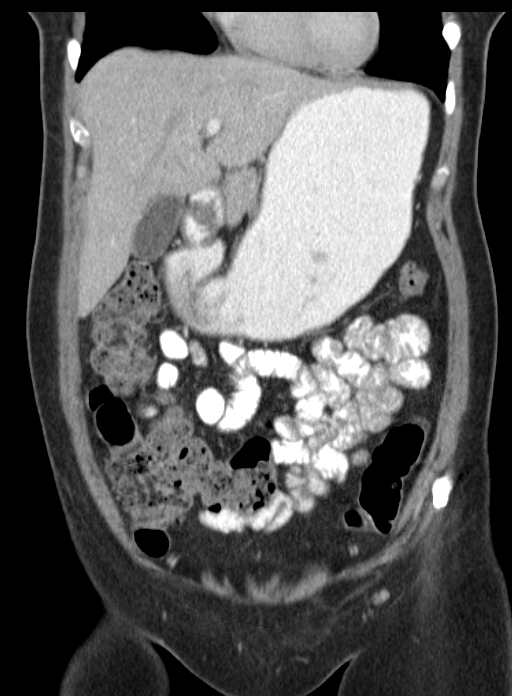
[im 37/83  soft-tissue]
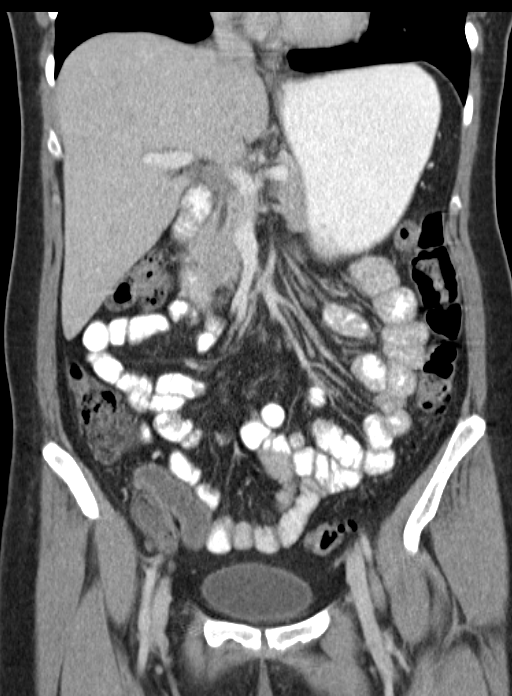
[im 46/83  soft-tissue]
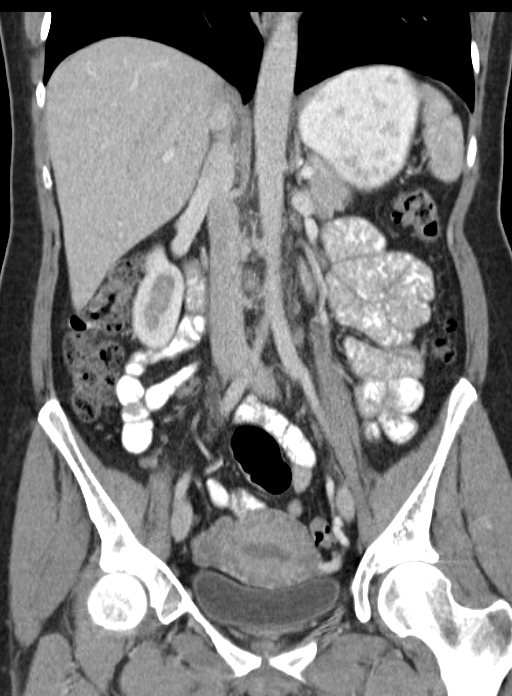

[16 of 46 positions shown; findings below may reference images not displayed]

FINDINGS: Lung bases are clear.

Liver, spleen, pancreas, and adrenal glands are within normal
limits.

Gallbladder is unremarkable. No intrahepatic or extrahepatic ductal
dilatation.

Kidneys are within normal limits.  No hydronephrosis.

No evidence of bowel obstruction. Normal appendix. Colonic
diverticulosis, without associated inflammatory changes.

No evidence of abdominal aortic aneurysm.

No suspicious abdominopelvic lymphadenopathy.

Trace pelvic fluid, likely physiologic.

Uterus and bilateral ovaries are within normal limits, noting a
cm involuting right corpus luteal cyst, physiologic.

Bladder is within normal limits.

Tiny fat containing bilateral inguinal hernias.

Visualized osseous structures are within normal limits.
IMPRESSION: No evidence of bowel obstruction.  Normal appendix.

Colonic diverticulosis, without CT findings to suggest
diverticulitis.

1.6 cm involuting right corpus luteal cyst, physiologic.

No CT findings to account for the patient's left lower quadrant
abdominal pain.

## 2016-05-30 ENCOUNTER — Telehealth: Payer: Self-pay | Admitting: Obstetrics and Gynecology

## 2016-05-30 NOTE — Telephone Encounter (Signed)
LMOVM to return call.

## 2016-05-30 NOTE — Telephone Encounter (Signed)
Patient states she is still bleeding even while taking Megace. She is also concerned she may have a blood clot in her left leg, swollen but not warm to touch states "something doesn't feel right". Advised patient that she needed to be evaluated as soon as possible. She stated she would call her primary care doctor. Also informed that some people take Megace for years for abnormal bleeding but I could make her an appointment to be seen. Patient stated she would wait and follow-up in 3 months.

## 2016-06-28 DIAGNOSIS — N926 Irregular menstruation, unspecified: Secondary | ICD-10-CM | POA: Diagnosis not present

## 2016-06-28 DIAGNOSIS — Z6824 Body mass index (BMI) 24.0-24.9, adult: Secondary | ICD-10-CM | POA: Diagnosis not present

## 2016-07-09 ENCOUNTER — Ambulatory Visit: Payer: BLUE CROSS/BLUE SHIELD | Admitting: Obstetrics and Gynecology

## 2016-09-11 DIAGNOSIS — N926 Irregular menstruation, unspecified: Secondary | ICD-10-CM | POA: Diagnosis not present

## 2016-09-11 DIAGNOSIS — N951 Menopausal and female climacteric states: Secondary | ICD-10-CM | POA: Diagnosis not present

## 2016-09-11 DIAGNOSIS — E039 Hypothyroidism, unspecified: Secondary | ICD-10-CM | POA: Diagnosis not present

## 2016-09-11 DIAGNOSIS — Z6824 Body mass index (BMI) 24.0-24.9, adult: Secondary | ICD-10-CM | POA: Diagnosis not present

## 2016-11-05 ENCOUNTER — Telehealth: Payer: Self-pay | Admitting: Cardiology

## 2016-11-05 ENCOUNTER — Other Ambulatory Visit: Payer: Self-pay | Admitting: *Deleted

## 2016-11-05 MED ORDER — ATENOLOL 50 MG PO TABS: 50.0000 mg | ORAL_TABLET | Freq: Every day | ORAL | 2 refills | Status: DC

## 2016-11-05 NOTE — Telephone Encounter (Signed)
New message      *STAT* If patient is at the pharmacy, call can be transferred to refill team.   1. Which medications need to be refilled? (please list name of each medication and dose if known)  atenolol (TENORMIN) 50 MG tablet Take 1 tablet (50 mg total) by      2. Which pharmacy/location (including street and city if local pharmacy) is medication to be sent to? [facility]   3. Do they need a 30 day or 90 day supply?  [place]

## 2017-04-09 ENCOUNTER — Encounter: Payer: Self-pay | Admitting: Physician Assistant

## 2017-04-09 ENCOUNTER — Ambulatory Visit (INDEPENDENT_AMBULATORY_CARE_PROVIDER_SITE_OTHER): Payer: BLUE CROSS/BLUE SHIELD | Admitting: Physician Assistant

## 2017-04-09 VITALS — BP 120/76 | HR 68 | Ht 62.0 in | Wt 143.8 lb

## 2017-04-09 DIAGNOSIS — R531 Weakness: Secondary | ICD-10-CM | POA: Diagnosis not present

## 2017-04-09 DIAGNOSIS — N926 Irregular menstruation, unspecified: Secondary | ICD-10-CM

## 2017-04-09 DIAGNOSIS — E039 Hypothyroidism, unspecified: Secondary | ICD-10-CM | POA: Diagnosis not present

## 2017-04-09 DIAGNOSIS — R55 Syncope and collapse: Secondary | ICD-10-CM | POA: Diagnosis not present

## 2017-04-09 DIAGNOSIS — N951 Menopausal and female climacteric states: Secondary | ICD-10-CM | POA: Diagnosis not present

## 2017-04-09 DIAGNOSIS — I341 Nonrheumatic mitral (valve) prolapse: Secondary | ICD-10-CM | POA: Diagnosis not present

## 2017-04-09 NOTE — Progress Notes (Signed)
 BP 120/76   Pulse 68   Ht 5' 2" (1.575 m)   Wt 143 lb 12.8 oz (65.2 kg)   BMI 26.30 kg/m    Subjective:    Patient ID: Sandra Powell, female    DOB: 06/04/1970, 47 y.o.   MRN: [id number]  HPI: Sandra Powell is a 47 y.o. female presenting on 04/09/2017 for Loss of Consciousness (Last Wednesday ) A few days ago the patient had been taking a hot bath, when she stood up and as she was walking out of her bathroom became syncopal and fell in the doorway.  She quickly was able to get back up.  She did not hit anything significantly.  She did not lose consciousness completely.  She has had a history of MVP and is followed by cardiology. She still does in general is not feeling well.  At times she feels like she is dying but cannot related to any specific symptoms that is happening in her body.  She does have hypothyroidism that is mildly range.  She is treated with Armour Thyroid 15 mg this was placed on her by her gynecologist.  We will recheck this today.  She does have perimenopausal symptoms.  And significant heavy menses.  We will check to see if there is any anemia.   Past Medical History:  Diagnosis Date  . Anemia   . Anxiety   . Depression   . Diverticulosis   . GERD (gastroesophageal reflux disease)   . MVP (mitral valve prolapse)   . Syncope    Relevant past medical, surgical, family and social history reviewed and updated as indicated. Interim medical history since our last visit reviewed. Allergies and medications reviewed and updated. DATA REVIEWED: CHART IN EPIC  Family History reviewed for pertinent findings.  Review of Systems  Constitutional: Positive for fatigue. Negative for activity change and fever.  HENT: Negative.   Eyes: Negative.   Respiratory: Negative.  Negative for cough.   Cardiovascular: Positive for palpitations. Negative for chest pain.  Gastrointestinal: Negative.  Negative for abdominal pain.  Endocrine: Negative.   Genitourinary: Negative.  Negative  for dysuria.  Musculoskeletal: Negative.   Skin: Negative.   Neurological: Positive for dizziness, weakness and light-headedness. Negative for tremors, seizures, speech difficulty, numbness and headaches.    Allergies as of 04/09/2017   No Known Allergies     Medication List        Accurate as of 04/09/17 11:59 PM. Always use your most recent med list.          atenolol 50 MG tablet Commonly known as:  TENORMIN Take 1 tablet (50 mg total) daily by mouth.   NP THYROID 15 MG tablet Generic drug:  thyroid ARMOUR          Objective:    BP 120/76   Pulse 68   Ht 5' 2" (1.575 m)   Wt 143 lb 12.8 oz (65.2 kg)   BMI 26.30 kg/m   No Known Allergies  Wt Readings from Last 3 Encounters:  04/09/17 143 lb 12.8 oz (65.2 kg)  05/02/16 138 lb (62.6 kg)  04/09/16 139 lb 3.2 oz (63.1 kg)    Physical Exam  Constitutional: She is oriented to person, place, and time. She appears well-developed and well-nourished.  HENT:  Head: Normocephalic and atraumatic.  Right Ear: Tympanic membrane, external ear and ear canal normal.  Left Ear: Tympanic membrane, external ear and ear canal normal.  Nose: Nose normal. No rhinorrhea.  Mouth/Throat: Oropharynx   is clear and moist and mucous membranes are normal. No oropharyngeal exudate or posterior oropharyngeal erythema.  Eyes: Pupils are equal, round, and reactive to light. Conjunctivae and EOM are normal.  Neck: Normal range of motion. Neck supple.  Cardiovascular: Normal rate, regular rhythm, normal heart sounds and intact distal pulses.  Pulmonary/Chest: Effort normal and breath sounds normal.  Abdominal: Soft. Bowel sounds are normal.  Neurological: She is alert and oriented to person, place, and time. She has normal reflexes.  Skin: Skin is warm and dry. No rash noted.  Psychiatric: She has a normal mood and affect. Her behavior is normal. Judgment and thought content normal.    Results for orders placed or performed in visit on 04/09/17   CMP14+EGFR  Result Value Ref Range   Glucose 80 65 - 99 mg/dL   BUN 8 6 - 24 mg/dL   Creatinine, Ser 0.55 (L) 0.57 - 1.00 mg/dL   GFR calc non Af Amer 112 >59 mL/min/1.73   GFR calc Af Amer 129 >59 mL/min/1.73   BUN/Creatinine Ratio 15 9 - 23   Sodium 138 134 - 144 mmol/L   Potassium 4.0 3.5 - 5.2 mmol/L   Chloride 101 96 - 106 mmol/L   CO2 24 20 - 29 mmol/L   Calcium 9.3 8.7 - 10.2 mg/dL   Total Protein 6.8 6.0 - 8.5 g/dL   Albumin 4.5 3.5 - 5.5 g/dL   Globulin, Total 2.3 1.5 - 4.5 g/dL   Albumin/Globulin Ratio 2.0 1.2 - 2.2   Bilirubin Total 0.4 0.0 - 1.2 mg/dL   Alkaline Phosphatase 61 39 - 117 IU/L   AST 32 0 - 40 IU/L   ALT 45 (H) 0 - 32 IU/L  Lipid panel  Result Value Ref Range   Cholesterol, Total 189 100 - 199 mg/dL   Triglycerides 94 0 - 149 mg/dL   HDL 44 >39 mg/dL   VLDL Cholesterol Cal 19 5 - 40 mg/dL   LDL Calculated 126 (H) 0 - 99 mg/dL   Chol/HDL Ratio 4.3 0.0 - 4.4 ratio  TSH  Result Value Ref Range   TSH 5.630 (H) 0.450 - 4.500 uIU/mL  Anemia Profile B  Result Value Ref Range   Total Iron Binding Capacity 337 250 - 450 ug/dL   UIBC 215 131 - 425 ug/dL   Iron 122 27 - 159 ug/dL   Iron Saturation 36 15 - 55 %   Ferritin 19 15 - 150 ng/mL   Vitamin B-12 479 232 - 1,245 pg/mL   Folate 15.9 >3.0 ng/mL   WBC 8.8 3.4 - 10.8 x10E3/uL   RBC 4.48 3.77 - 5.28 x10E6/uL   Hemoglobin 13.4 11.1 - 15.9 g/dL   Hematocrit 40.5 34.0 - 46.6 %   MCV 90 79 - 97 fL   MCH 29.9 26.6 - 33.0 pg   MCHC 33.1 31.5 - 35.7 g/dL   RDW 13.3 12.3 - 15.4 %   Platelets 259 150 - 379 x10E3/uL   Neutrophils 58 Not Estab. %   Lymphs 29 Not Estab. %   Monocytes 11 Not Estab. %   Eos 2 Not Estab. %   Basos 0 Not Estab. %   Neutrophils Absolute 5.1 1.4 - 7.0 x10E3/uL   Lymphocytes Absolute 2.5 0.7 - 3.1 x10E3/uL   Monocytes Absolute 1.0 (H) 0.1 - 0.9 x10E3/uL   EOS (ABSOLUTE) 0.2 0.0 - 0.4 x10E3/uL   Basophils Absolute 0.0 0.0 - 0.2 x10E3/uL   Immature Granulocytes 0 Not Estab. %       Immature Grans (Abs) 0.0 0.0 - 0.1 x10E3/uL   Retic Ct Pct 1.6 0.6 - 2.6 %      Assessment & Plan:   .1. Mitral valve prolapse - Ambulatory referral to Cardiology  2. Irregular menses  3. Acquired hypothyroidism - NP THYROID 15 MG tablet; ARMOUR; Refill: 12 - TSH  4. Syncope, unspecified syncope type - CMP14+EGFR - Lipid panel - TSH - Anemia Profile B - Ambulatory referral to Cardiology  5. Weakness - CMP14+EGFR - Lipid panel - TSH - Anemia Profile B - Ambulatory referral to Cardiology  6. Perimenopausal - Anemia Profile B    Continue all other maintenance medications as listed above.  Follow up plan: No follow-ups on file.  Educational handout given for survey  Angel S. Jones PA-C [facility] [address]  [place], [place] [address] [phone]   04/10/2017, 9:47 AM                                                                                                                                                          

## 2017-04-09 NOTE — Patient Instructions (Signed)
[facility]

## 2017-04-10 ENCOUNTER — Other Ambulatory Visit: Payer: Self-pay | Admitting: *Deleted

## 2017-04-10 DIAGNOSIS — E039 Hypothyroidism, unspecified: Secondary | ICD-10-CM

## 2017-04-10 LAB — ANEMIA PROFILE B
BASOS: 0 %
Basophils Absolute: 0 10*3/uL (ref 0.0–0.2)
EOS (ABSOLUTE): 0.2 10*3/uL (ref 0.0–0.4)
Eos: 2 %
FOLATE: 15.9 ng/mL (ref >3.0)
Ferritin: 19 ng/mL (ref 15–150)
HEMATOCRIT: 40.5 % (ref 34.0–46.6)
Hemoglobin: 13.4 g/dL (ref 11.1–15.9)
IMMATURE GRANS (ABS): 0 10*3/uL (ref 0.0–0.1)
IRON: 122 ug/dL (ref 27–159)
Immature Granulocytes: 0 %
Iron Saturation: 36 % (ref 15–55)
LYMPHS: 29 %
Lymphocytes Absolute: 2.5 10*3/uL (ref 0.7–3.1)
MCH: 29.9 pg (ref 26.6–33.0)
MCHC: 33.1 g/dL (ref 31.5–35.7)
MCV: 90 fL (ref 79–97)
MONOCYTES: 11 %
Monocytes Absolute: 1 10*3/uL — ABNORMAL HIGH (ref 0.1–0.9)
NEUTROS ABS: 5.1 10*3/uL (ref 1.4–7.0)
Neutrophils: 58 %
Platelets: 259 10*3/uL (ref 150–379)
RBC: 4.48 x10E6/uL (ref 3.77–5.28)
RDW: 13.3 % (ref 12.3–15.4)
Retic Ct Pct: 1.6 % (ref 0.6–2.6)
TIBC: 337 ug/dL (ref 250–450)
UIBC: 215 ug/dL (ref 131–425)
VITAMIN B 12: 479 pg/mL (ref 232–1245)
WBC: 8.8 10*3/uL (ref 3.4–10.8)

## 2017-04-10 LAB — TSH: TSH: 5.63 u[IU]/mL — AB (ref 0.450–4.500)

## 2017-04-10 LAB — CMP14+EGFR
ALT: 45 IU/L — AB (ref 0–32)
AST: 32 IU/L (ref 0–40)
Albumin/Globulin Ratio: 2 (ref 1.2–2.2)
Albumin: 4.5 g/dL (ref 3.5–5.5)
Alkaline Phosphatase: 61 IU/L (ref 39–117)
BILIRUBIN TOTAL: 0.4 mg/dL (ref 0.0–1.2)
BUN/Creatinine Ratio: 15 (ref 9–23)
BUN: 8 mg/dL (ref 6–24)
CALCIUM: 9.3 mg/dL (ref 8.7–10.2)
CHLORIDE: 101 mmol/L (ref 96–106)
CO2: 24 mmol/L (ref 20–29)
Creatinine, Ser: 0.55 mg/dL — ABNORMAL LOW (ref 0.57–1.00)
GFR, EST AFRICAN AMERICAN: 129 mL/min/{1.73_m2} (ref >59)
GFR, EST NON AFRICAN AMERICAN: 112 mL/min/{1.73_m2} (ref >59)
Globulin, Total: 2.3 g/dL (ref 1.5–4.5)
Glucose: 80 mg/dL (ref 65–99)
POTASSIUM: 4 mmol/L (ref 3.5–5.2)
Sodium: 138 mmol/L (ref 134–144)
TOTAL PROTEIN: 6.8 g/dL (ref 6.0–8.5)

## 2017-04-10 LAB — LIPID PANEL
CHOL/HDL RATIO: 4.3 ratio (ref 0.0–4.4)
Cholesterol, Total: 189 mg/dL (ref 100–199)
HDL: 44 mg/dL (ref >39)
LDL Calculated: 126 mg/dL — ABNORMAL HIGH (ref 0–99)
Triglycerides: 94 mg/dL (ref 0–149)
VLDL Cholesterol Cal: 19 mg/dL (ref 5–40)

## 2017-04-18 ENCOUNTER — Ambulatory Visit (INDEPENDENT_AMBULATORY_CARE_PROVIDER_SITE_OTHER): Payer: BLUE CROSS/BLUE SHIELD | Admitting: Physician Assistant

## 2017-04-18 ENCOUNTER — Encounter: Payer: Self-pay | Admitting: Physician Assistant

## 2017-04-18 VITALS — BP 119/68 | HR 65 | Ht 62.0 in | Wt 146.4 lb

## 2017-04-18 DIAGNOSIS — Z01419 Encounter for gynecological examination (general) (routine) without abnormal findings: Secondary | ICD-10-CM

## 2017-04-19 ENCOUNTER — Telehealth: Payer: Self-pay | Admitting: Clinical

## 2017-04-19 NOTE — Telephone Encounter (Signed)
This [facility] specialist left message to call back with name and contact information. 

## 2017-04-20 LAB — PAP IG W/ RFLX HPV ASCU: PAP Smear Comment: 0

## 2017-04-21 NOTE — Progress Notes (Signed)
BP 119/68   Pulse 65   Ht 5\' 2"  (1.575 m)   Wt 146 lb 6.4 oz (66.4 kg)   BMI 26.78 kg/m    Subjective:    Patient ID: Sandra Powell, female    DOB: April 29, 1970, 47 y.o.   MRN: [id number]  HPI: Sandra Powell is a 47 y.o. female presenting on 04/18/2017 for Annual Exam and Hemorrhoids  This patient comes in for annual well physical examination. All medications are reviewed today. There are no reports of any problems with the medications. All of the medical conditions are reviewed and updated.  Lab work is reviewed and will be ordered as medically necessary. There are no new problems reported with today's visit.  Patient reports doing well overall.  Hemorrhoids have been present for many ye isars, has no severe swelling because of or itching. Occasional blood.  Past Medical History:  Diagnosis Date  . Anemia   . Anxiety   . Depression   . Diverticulosis   . GERD (gastroesophageal reflux disease)   . MVP (mitral valve prolapse)   . Syncope    Relevant past medical, surgical, family and social history reviewed and updated as indicated. Interim medical history since our last visit reviewed. Allergies and medications reviewed and updated. DATA REVIEWED: CHART IN EPIC  Family History reviewed for pertinent findings.  Review of Systems  Constitutional: Negative.   HENT: Negative.   Eyes: Negative.   Respiratory: Negative.   Gastrointestinal: Negative.   Genitourinary: Negative.     Allergies as of 04/18/2017   No Known Allergies     Medication List        Accurate as of 04/18/17 11:59 PM. Always use your most recent med list.          atenolol 50 MG tablet Commonly known as:  TENORMIN Take 1 tablet (50 mg total) daily by mouth.   NP THYROID 15 MG tablet Generic drug:  thyroid ARMOUR          Objective:    BP 119/68   Pulse 65   Ht 5\' 2"  (1.575 m)   Wt 146 lb 6.4 oz (66.4 kg)   BMI 26.78 kg/m   No Known Allergies  Wt Readings from Last 3 Encounters:    04/18/17 146 lb 6.4 oz (66.4 kg)  04/09/17 143 lb 12.8 oz (65.2 kg)  05/02/16 138 lb (62.6 kg)    Physical Exam  Constitutional: She is oriented to person, place, and time. She appears well-developed and well-nourished.  HENT:  Head: Normocephalic and atraumatic.  Eyes: Pupils are equal, round, and reactive to light. Conjunctivae and EOM are normal.  Neck: Normal range of motion. Neck supple.  Cardiovascular: Normal rate, regular rhythm, normal heart sounds and intact distal pulses.  Pulmonary/Chest: Effort normal and breath sounds normal. Right breast exhibits no mass, no skin change and no tenderness. Left breast exhibits no mass, no skin change and no tenderness. No breast tenderness, discharge or bleeding. Breasts are symmetrical.  Abdominal: Soft. Bowel sounds are normal.  Genitourinary: Vagina normal and uterus normal. Rectal exam shows no fissure. No breast tenderness, discharge or bleeding. There is no tenderness or lesion on the right labia. There is no tenderness or lesion on the left labia. Uterus is not deviated, not enlarged and not tender. Cervix exhibits no motion tenderness, no discharge and no friability. Right adnexum displays no mass, no tenderness and no fullness. Left adnexum displays no mass, no tenderness and no fullness. No tenderness or  bleeding in the vagina. No vaginal discharge found.  Neurological: She is alert and oriented to person, place, and time. She has normal reflexes.  Skin: Skin is warm and dry. No rash noted.  Psychiatric: She has a normal mood and affect. Her behavior is normal. Judgment and thought content normal.        Assessment & Plan:   1. Well female exam with routine gynecological exam - MM Digital Screening; Future - Pap IG w/ reflex to HPV when ASC-U   Continue all other maintenance medications as listed above.  Follow up plan: No follow-ups on file.  Educational handout given for well exam  Terald Sleeper PA-C [facility] [address]  [place], [place] [address] [phone]   04/21/2017, 11:32 PM

## 2017-05-13 ENCOUNTER — Ambulatory Visit: Payer: BLUE CROSS/BLUE SHIELD | Admitting: Cardiology

## 2017-05-28 DIAGNOSIS — Z1231 Encounter for screening mammogram for malignant neoplasm of breast: Secondary | ICD-10-CM | POA: Diagnosis not present

## 2017-05-28 LAB — HM MAMMOGRAPHY

## 2017-06-28 DIAGNOSIS — Z9181 History of falling: Secondary | ICD-10-CM | POA: Diagnosis not present

## 2017-06-28 DIAGNOSIS — M25571 Pain in right ankle and joints of right foot: Secondary | ICD-10-CM | POA: Diagnosis not present

## 2017-06-28 DIAGNOSIS — S93409A Sprain of unspecified ligament of unspecified ankle, initial encounter: Secondary | ICD-10-CM | POA: Diagnosis not present

## 2017-07-11 ENCOUNTER — Other Ambulatory Visit: Payer: BLUE CROSS/BLUE SHIELD

## 2017-07-11 ENCOUNTER — Ambulatory Visit (INDEPENDENT_AMBULATORY_CARE_PROVIDER_SITE_OTHER): Payer: BLUE CROSS/BLUE SHIELD

## 2017-07-11 ENCOUNTER — Encounter: Payer: Self-pay | Admitting: Family Medicine

## 2017-07-11 ENCOUNTER — Ambulatory Visit (INDEPENDENT_AMBULATORY_CARE_PROVIDER_SITE_OTHER): Payer: BLUE CROSS/BLUE SHIELD | Admitting: Family Medicine

## 2017-07-11 VITALS — BP 128/64 | HR 66 | Temp 98.1°F | Ht 62.0 in | Wt 139.6 lb

## 2017-07-11 DIAGNOSIS — S93491A Sprain of other ligament of right ankle, initial encounter: Secondary | ICD-10-CM

## 2017-07-11 DIAGNOSIS — M25571 Pain in right ankle and joints of right foot: Secondary | ICD-10-CM | POA: Diagnosis not present

## 2017-07-11 NOTE — Progress Notes (Signed)
BP 128/64 (BP Location: Left Arm, Patient Position: Sitting, Cuff Size: Normal)   Pulse 66   Temp 98.1 F (36.7 C) (Oral)   Ht 5\' 2"  (1.575 m)   Wt 139 lb 9.6 oz (63.3 kg)   BMI 25.53 kg/m    Subjective:    Patient ID: Sandra Powell, female    DOB: 10-May-1970, 47 y.o.   MRN: [id number]  HPI: Sandra Powell is a 47 y.o. female presenting on 07/11/2017 for Ankle Pain (Right, injured on 6/28, seen at urgent care)   HPI Right ankle injury Patient comes in for right ankle injury and pain mold title she was seen on 628 at an urgent care for this and she is still having continued pain.  They put her in a hard splint and she has been wearing it since.  They did not tell it was fractured but said they could not see for sure this was why they recommend the splint.  She is able to weight-bear but does have some pain both on the medial and lateral aspects now she is developed more pain under the heel consistent with plantar fasciitis.  She denies any redness or warmth.  She still does have a little bit of swelling left but most of that has improved with icing and ibuprofen.  Relevant past medical, surgical, family and social history reviewed and updated as indicated. Interim medical history since our last visit reviewed. Allergies and medications reviewed and updated.  Review of Systems  Constitutional: Negative for chills and fever.  Eyes: Negative for visual disturbance.  Respiratory: Negative for chest tightness and shortness of breath.   Cardiovascular: Negative for chest pain and leg swelling.  Musculoskeletal: Positive for arthralgias and joint swelling. Negative for back pain and gait problem.  Skin: Negative for color change and rash.  Neurological: Negative for light-headedness and headaches.  Psychiatric/Behavioral: Negative for agitation and behavioral problems.  All other systems reviewed and are negative.   Per HPI unless specifically indicated above   Allergies as of 07/11/2017    No Known Allergies     Medication List        Accurate as of 07/11/17 10:03 AM. Always use your most recent med list.          atenolol 50 MG tablet Commonly known as:  TENORMIN Take 1 tablet (50 mg total) daily by mouth.   NP THYROID 15 MG tablet Generic drug:  thyroid ARMOUR          Objective:    BP 128/64 (BP Location: Left Arm, Patient Position: Sitting, Cuff Size: Normal)   Pulse 66   Temp 98.1 F (36.7 C) (Oral)   Ht 5\' 2"  (1.575 m)   Wt 139 lb 9.6 oz (63.3 kg)   BMI 25.53 kg/m   Wt Readings from Last 3 Encounters:  07/11/17 139 lb 9.6 oz (63.3 kg)  04/18/17 146 lb 6.4 oz (66.4 kg)  04/09/17 143 lb 12.8 oz (65.2 kg)    Physical Exam  Constitutional: She is oriented to person, place, and time. She appears well-developed and well-nourished. No distress.  Eyes: Conjunctivae are normal.  Cardiovascular:  No murmur heard. Musculoskeletal: Normal range of motion. She exhibits no edema.       Right ankle: She exhibits swelling (Mild swelling). She exhibits normal range of motion, no ecchymosis and no deformity. Tenderness (Mild tenderness at medial lateral malleolus but most of the tenderness is over the lateral ligaments, mild tenderness under heel consistent with plantar  fasciitis). Lateral malleolus, medial malleolus and AITFL tenderness found. Achilles tendon exhibits no pain and no defect.  Neurological: She is alert and oriented to person, place, and time. Coordination normal.  Skin: Skin is warm and dry. She is not diaphoretic. No erythema.  Nursing note and vitals reviewed.   Right ankle x-ray: No signs of acute bony abnormality, await final read from radiology    Assessment & Plan:   Problem List Items Addressed This Visit    None    Visit Diagnoses    Sprain of anterior talofibular ligament of right ankle, initial encounter    -  Primary   Recommended continuing stretching icing exercise   Relevant Orders   DG Ankle Complete Right        Follow up plan: Return if symptoms worsen or fail to improve.  Counseling provided for all of the vaccine components Orders Placed This Encounter  Procedures  . DG Ankle Complete Right    Caryl Pina, MD [facility] 07/11/2017, 10:03 AM

## 2017-07-15 NOTE — Progress Notes (Deleted)
   HPI The patient presents for evaluation of MVP.  She has a previous history of syncope.  She an extensive work up in [place].  ***   She returns for follow up.  Since I last saw her she has done well.  The patient denies any new symptoms such as chest discomfort, neck or arm discomfort. There has been no new shortness of breath, PND or orthopnea. There have been no reported palpitations, presyncope or syncope.     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  She had palpitations and could not tolerate this.  She had had this problem in the past as well.  Since this med has stopped she has felt well.     No Known Allergies  Current Outpatient Medications  Medication Sig Dispense Refill  . atenolol (TENORMIN) 50 MG tablet Take 1 tablet (50 mg total) daily by mouth. 90 tablet 2  . NP THYROID 15 MG tablet ARMOUR  12   No current facility-administered medications for this visit.     Past Medical History:  Diagnosis Date  . Anemia   . Anxiety   . Depression   . Diverticulosis   . GERD (gastroesophageal reflux disease)   . MVP (mitral valve prolapse)   . Syncope     Past Surgical History:  Procedure Laterality Date  . INDUCED ABORTION  2000  . TONSILLECTOMY       ROS:    ***  PHYSICAL EXAM There were no vitals taken for this visit.  GENERAL:  Well appearing NECK:  No jugular venous distention, waveform within normal limits, carotid upstroke brisk and symmetric, no bruits, no thyromegaly LUNGS:  Clear to auscultation bilaterally CHEST:  Unremarkable HEART:  PMI not displaced or sustained,S1 and S2 within normal limits, no S3, no S4, no clicks, no rubs, *** murmurs ABD:  Flat, positive bowel sounds normal in frequency in pitch, no bruits, no rebound, no guarding, no midline pulsatile mass, no hepatomegaly, no splenomegaly EXT:  2 plus pulses throughout, no edema, no cyanosis no clubbing   *** GENERAL:  Well  appearing NECK:  No jugular venous distention, waveform within normal limits, carotid upstroke brisk and symmetric, no bruits, no thyromegaly LUNGS:  Clear to auscultation bilaterally HEART:  PMI not displaced or sustained,S1 and S2 within normal limits, no S3, no S4, no clicks, no rubs, no murmurs ABD:  Flat, positive bowel sounds normal in frequency in pitch, no bruits, no rebound, no guarding, no midline pulsatile mass, no hepatomegaly, no splenomegaly EXT:  2 plus pulses throughout, no edema, no cyanosis no clubbing  EKG:  ***  Slightly unusual P-wave axis ectopic atrial rhythm, axis within normal limits, intervals within normal limits, rate 64, no acute ST-T wave changes. 07/15/2017  ASSESSMENT AND PLAN  SYNCOPE:   ***  The patient has had no further events. No further testing is indicated.  MVP:  ***  She has no recurrent symptoms and no change in her exam.  She can remain on the beta blocker.  No further imaging is indicated at this point.

## 2017-07-17 ENCOUNTER — Ambulatory Visit: Payer: BLUE CROSS/BLUE SHIELD | Admitting: Cardiology

## 2017-07-19 ENCOUNTER — Telehealth: Payer: Self-pay | Admitting: Licensed Clinical Social Worker

## 2017-07-19 DIAGNOSIS — Z0389 Encounter for observation for other suspected diseases and conditions ruled out: Principal | ICD-10-CM

## 2017-07-19 DIAGNOSIS — F489 Nonpsychotic mental disorder, unspecified: Secondary | ICD-10-CM

## 2017-07-19 NOTE — BH Specialist Note (Signed)
This [facility] specialist left message to call back with name and contact information. 

## 2017-07-23 NOTE — Progress Notes (Signed)
HPI The patient presents for evaluation of MVP.  She has a previous history of syncope.  She an extensive work up in [place].    She called to schedule an appointment because about 3 months ago she had an episode of near syncope.  She said she was taking a bath.  She stood up.  She became very lightheaded and almost passed out.  She said that at that point she felt like "I was going to die."  She has a hard time qualifying those symptoms.  She unfortunately did not call EMS.  The sensation passed.  She was not particularly describing palpitations.  She has not been having chest pressure, neck or arm discomfort.  She did have one episode of jaw discomfort somewhere around that time and thought about strokelike symptoms or Bell's palsy.  However, she did not have any visual or motor disturbances.  Shortly after that she sprained her ankle and she is been limited in what she is been able to do and she thinks this was God's way of slowing her down so that she did not have a stroke or get other symptoms.  She is had no further presyncopal episodes since then.  She is had no further arm or jaw discomfort.  She is had no new shortness of breath, PND or orthopnea.   No Known Allergies  Current Outpatient Medications  Medication Sig Dispense Refill  . atenolol (TENORMIN) 50 MG tablet Take 1 tablet (50 mg total) daily by mouth. 90 tablet 2  . NP THYROID 15 MG tablet Take 15 mg by mouth daily. ARMOUR  12   No current facility-administered medications for this visit.     Past Medical History:  Diagnosis Date  . Anemia   . Anxiety   . Depression   . Diverticulosis   . GERD (gastroesophageal reflux disease)   . MVP (mitral valve prolapse)   . Syncope     Past Surgical History:  Procedure Laterality Date  . INDUCED ABORTION  2000  . TONSILLECTOMY       ROS: Positive for menopause.  Otherwise as stated in the HPI and negative for all other systems.  PHYSICAL EXAM BP 120/80   Pulse 80   Ht  5\' 2"  (1.575 m)   Wt 141 lb (64 kg)   BMI 25.79 kg/m   GENERAL:  Well appearing NECK:  No jugular venous distention, waveform within normal limits, carotid upstroke brisk and symmetric, no bruits, no thyromegaly LUNGS:  Clear to auscultation bilaterally CHEST:  Unremarkable HEART:  PMI not displaced or sustained,S1 and S2 within normal limits, no S3, no S4, no clicks, no rubs, no murmurs ABD:  Flat, positive bowel sounds normal in frequency in pitch, no bruits, no rebound, no guarding, no midline pulsatile mass, no hepatomegaly, no splenomegaly EXT:  2 plus pulses throughout, no edema, no cyanosis no clubbing   EKG: Unusual P wave axis, rate 80, regular rhythm, no acute ST-T wave changes.07/24/2017  ASSESSMENT AND PLAN  SYNCOPE:    The patient had a near syncopal episode but no further syncope.  At this point in the absence of ongoing symptoms I do not think that further monitoring would be indicated.  Further testing will be as below.  She would let me know if she has any recurrent episodes.  She did have previous work-up in 2015 that was negative.  I told her that in the future should she have recurrent episodes it would be most  helpful to have her call EMS to get an acute evaluation as testing this far out is typically not going to yield a diagnosis.  MVP:   I will check an echocardiogram as this has not been done in years.   ABNORMAL THYROID LABS: I do note that she was supposed to have follow-up labs because her TSH was slightly abnormal and I will facilitate this.

## 2017-07-24 ENCOUNTER — Encounter: Payer: Self-pay | Admitting: Cardiology

## 2017-07-24 ENCOUNTER — Ambulatory Visit: Payer: BLUE CROSS/BLUE SHIELD | Admitting: Cardiology

## 2017-07-24 VITALS — BP 120/80 | HR 80 | Ht 62.0 in | Wt 141.0 lb

## 2017-07-24 DIAGNOSIS — I341 Nonrheumatic mitral (valve) prolapse: Secondary | ICD-10-CM | POA: Diagnosis not present

## 2017-07-24 DIAGNOSIS — R7989 Other specified abnormal findings of blood chemistry: Secondary | ICD-10-CM | POA: Diagnosis not present

## 2017-07-24 DIAGNOSIS — R55 Syncope and collapse: Secondary | ICD-10-CM | POA: Diagnosis not present

## 2017-07-24 NOTE — Patient Instructions (Signed)
Medication Instructions:  The current medical regimen is effective;  continue present plan and medications.  Labwork: Please have blood work at next blood drawn to check your TSH.  Testing/Procedures: Your physician has requested that you have an echocardiogram. Echocardiography is a painless test that uses sound waves to create images of your heart. It provides your doctor with information about the size and shape of your heart and how well your heart's chambers and valves are working. This procedure takes approximately one hour. There are no restrictions for this procedure.  Follow-Up: Follow up will be based on the the results of the above testing.  Thank you for choosing [facility]!!

## 2017-07-29 ENCOUNTER — Ambulatory Visit (HOSPITAL_COMMUNITY): Payer: BLUE CROSS/BLUE SHIELD | Attending: Internal Medicine

## 2017-07-29 ENCOUNTER — Other Ambulatory Visit: Payer: BLUE CROSS/BLUE SHIELD | Admitting: *Deleted

## 2017-07-29 ENCOUNTER — Other Ambulatory Visit: Payer: Self-pay

## 2017-07-29 DIAGNOSIS — R55 Syncope and collapse: Secondary | ICD-10-CM | POA: Diagnosis not present

## 2017-07-29 DIAGNOSIS — I340 Nonrheumatic mitral (valve) insufficiency: Secondary | ICD-10-CM | POA: Insufficient documentation

## 2017-07-29 DIAGNOSIS — Z8249 Family history of ischemic heart disease and other diseases of the circulatory system: Secondary | ICD-10-CM | POA: Insufficient documentation

## 2017-07-29 DIAGNOSIS — R7989 Other specified abnormal findings of blood chemistry: Secondary | ICD-10-CM

## 2017-07-29 DIAGNOSIS — I341 Nonrheumatic mitral (valve) prolapse: Secondary | ICD-10-CM | POA: Diagnosis not present

## 2017-07-29 DIAGNOSIS — I272 Pulmonary hypertension, unspecified: Secondary | ICD-10-CM | POA: Diagnosis not present

## 2017-07-29 DIAGNOSIS — I429 Cardiomyopathy, unspecified: Secondary | ICD-10-CM | POA: Insufficient documentation

## 2017-07-29 LAB — TSH: TSH: 4.902 u[IU]/mL — ABNORMAL HIGH (ref 0.350–4.500)

## 2017-07-30 ENCOUNTER — Other Ambulatory Visit: Payer: Self-pay

## 2017-07-30 DIAGNOSIS — R7989 Other specified abnormal findings of blood chemistry: Secondary | ICD-10-CM

## 2017-08-02 ENCOUNTER — Other Ambulatory Visit: Payer: Self-pay | Admitting: Cardiology

## 2017-08-06 ENCOUNTER — Other Ambulatory Visit: Payer: Self-pay

## 2017-08-06 ENCOUNTER — Other Ambulatory Visit: Payer: BLUE CROSS/BLUE SHIELD

## 2017-08-06 ENCOUNTER — Telehealth: Payer: Self-pay

## 2017-08-06 DIAGNOSIS — R7989 Other specified abnormal findings of blood chemistry: Secondary | ICD-10-CM | POA: Diagnosis not present

## 2017-08-06 DIAGNOSIS — R5383 Other fatigue: Secondary | ICD-10-CM | POA: Diagnosis not present

## 2017-08-06 DIAGNOSIS — Z01419 Encounter for gynecological examination (general) (routine) without abnormal findings: Secondary | ICD-10-CM

## 2017-08-06 NOTE — Telephone Encounter (Signed)
2nd attempt - [facility] left Msg

## 2017-08-07 LAB — T4: T4 TOTAL: 6.4 ug/dL (ref 4.5–12.0)

## 2017-08-07 LAB — T3: T3, Total: 133 ng/dL (ref 71–180)

## 2017-09-10 ENCOUNTER — Telehealth: Payer: Self-pay

## 2017-09-10 NOTE — Telephone Encounter (Signed)
[facility] - Several attempts have been made to contact patient without success. Patient will be placed on the inactive list.  If services are needed again.  Please contact [facility] at [phone].    Information will be routed to the PCP and Dr. Hisada  

## 2018-04-30 ENCOUNTER — Other Ambulatory Visit: Payer: Self-pay | Admitting: Cardiology

## 2018-04-30 NOTE — Telephone Encounter (Signed)
Atenolol 50 mg refilled.

## 2018-07-07 DIAGNOSIS — Z1231 Encounter for screening mammogram for malignant neoplasm of breast: Secondary | ICD-10-CM | POA: Diagnosis not present

## 2018-07-30 ENCOUNTER — Other Ambulatory Visit: Payer: Self-pay | Admitting: Cardiology

## 2018-08-25 ENCOUNTER — Other Ambulatory Visit: Payer: Self-pay | Admitting: *Deleted

## 2018-08-25 DIAGNOSIS — Z20822 Contact with and (suspected) exposure to covid-19: Secondary | ICD-10-CM

## 2018-08-26 LAB — NOVEL CORONAVIRUS, NAA: SARS-CoV-2, NAA: NOT DETECTED

## 2018-09-09 ENCOUNTER — Other Ambulatory Visit: Payer: Self-pay | Admitting: Cardiology

## 2018-09-25 ENCOUNTER — Other Ambulatory Visit: Payer: Self-pay | Admitting: Cardiology

## 2018-09-30 ENCOUNTER — Other Ambulatory Visit: Payer: Self-pay | Admitting: Cardiology

## 2018-10-13 DIAGNOSIS — E039 Hypothyroidism, unspecified: Secondary | ICD-10-CM | POA: Diagnosis not present

## 2018-10-13 DIAGNOSIS — Z01419 Encounter for gynecological examination (general) (routine) without abnormal findings: Secondary | ICD-10-CM | POA: Diagnosis not present

## 2018-10-13 DIAGNOSIS — N920 Excessive and frequent menstruation with regular cycle: Secondary | ICD-10-CM | POA: Diagnosis not present

## 2018-10-13 DIAGNOSIS — N924 Excessive bleeding in the premenopausal period: Secondary | ICD-10-CM | POA: Diagnosis not present

## 2018-10-13 DIAGNOSIS — Z6826 Body mass index (BMI) 26.0-26.9, adult: Secondary | ICD-10-CM | POA: Diagnosis not present

## 2018-10-14 NOTE — Progress Notes (Signed)
     HPI The patient presents for evaluation of MVP.  She has a previous history of syncope.  She an extensive work up in [place].   Since I last saw her she has had no new cardiovascular complaints. The patient denies any new symptoms such as chest discomfort, neck or arm discomfort. There has been no new shortness of breath, PND or orthopnea. There have been no reported palpitations, presyncope or syncope.  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    No Known Allergies  Current Outpatient Medications  Medication Sig Dispense Refill  . NP THYROID 15 MG tablet Take 15 mg by mouth daily. ARMOUR  12  . atenolol (TENORMIN) 50 MG tablet Take 1 tablet (50 mg total) by mouth daily. Please schedule appt for further refills final attempt (Patient not taking: Reported on 10/15/2018) 15 tablet 0   No current facility-administered medications for this visit.     Past Medical History:  Diagnosis Date  . Anemia   . Anxiety   . Depression   . Diverticulosis   . GERD (gastroesophageal reflux disease)   . MVP (mitral valve prolapse)   . Syncope     Past Surgical History:  Procedure Laterality Date  . INDUCED ABORTION  2000  . TONSILLECTOMY       ROS: Positive for none.  Otherwise as stated in the HPI and negative for all other systems.  PHYSICAL EXAM BP (!) 142/98   Pulse 80   Ht 5\' 2"  (1.575 m)   Wt 142 lb (64.4 kg)   BMI 25.97 kg/m   GENERAL:  Well appearing NECK:  No jugular venous distention, waveform within normal limits, carotid upstroke brisk and symmetric, no bruits, no thyromegaly LUNGS:  Clear to auscultation bilaterally CHEST:  Unremarkable HEART:  PMI not displaced or sustained,S1 and S2 within normal limits, no S3, no S4, no clicks, no rubs, no murmurs ABD:  Flat, positive bowel sounds normal in frequency in pitch, no bruits, no rebound, no guarding, no midline pulsatile mass, no hepatomegaly, no splenomegaly EXT:  2 plus pulses throughout, no edema, no  cyanosis no clubbing  EKG:    NSR, rate 80, regular rhythm, no acute ST-T wave changes.10/15/2018  ASSESSMENT AND PLAN  SYNCOPE:    She has had no further episodes.  She had an extensive work-up.  No further work-up is indicated.   MVP:   She had non MVP noted on previous echo last year.  No change in therapy.  No further imaging.  ABNORMAL THYROID LABS:   These were normal on follow up in August.  She is having this followed soon by her primary provider.

## 2018-10-15 ENCOUNTER — Ambulatory Visit: Payer: BC Managed Care – PPO | Admitting: Cardiology

## 2018-10-15 ENCOUNTER — Other Ambulatory Visit: Payer: Self-pay

## 2018-10-15 ENCOUNTER — Encounter: Payer: Self-pay | Admitting: Cardiology

## 2018-10-15 VITALS — BP 142/98 | HR 80 | Ht 62.0 in | Wt 142.0 lb

## 2018-10-15 DIAGNOSIS — I341 Nonrheumatic mitral (valve) prolapse: Secondary | ICD-10-CM | POA: Diagnosis not present

## 2018-10-15 MED ORDER — ATENOLOL 50 MG PO TABS: 50.0000 mg | ORAL_TABLET | Freq: Every day | ORAL | 3 refills | Status: DC

## 2018-10-15 NOTE — Patient Instructions (Signed)
Medication Instructions:  The current medical regimen is effective;  continue present plan and medications.  If you need a refill on your cardiac medications before your next appointment, please call your pharmacy.   Follow-Up: Follow up in 1 year with Dr. Hochrein.  You will receive a letter in the mail 2 months before you are due.  Please call us when you receive this letter to schedule your follow up appointment.  Thank you for choosing [facility]!!     

## 2018-10-24 DIAGNOSIS — N924 Excessive bleeding in the premenopausal period: Secondary | ICD-10-CM | POA: Diagnosis not present

## 2018-10-24 DIAGNOSIS — Z6826 Body mass index (BMI) 26.0-26.9, adult: Secondary | ICD-10-CM | POA: Diagnosis not present

## 2018-10-24 DIAGNOSIS — D259 Leiomyoma of uterus, unspecified: Secondary | ICD-10-CM | POA: Diagnosis not present

## 2018-10-24 DIAGNOSIS — N920 Excessive and frequent menstruation with regular cycle: Secondary | ICD-10-CM | POA: Diagnosis not present

## 2018-11-11 DIAGNOSIS — N920 Excessive and frequent menstruation with regular cycle: Secondary | ICD-10-CM | POA: Diagnosis not present

## 2018-11-11 DIAGNOSIS — Z01818 Encounter for other preprocedural examination: Secondary | ICD-10-CM | POA: Diagnosis not present

## 2018-11-11 DIAGNOSIS — I10 Essential (primary) hypertension: Secondary | ICD-10-CM | POA: Diagnosis not present

## 2018-11-13 DIAGNOSIS — N920 Excessive and frequent menstruation with regular cycle: Secondary | ICD-10-CM | POA: Diagnosis not present

## 2018-11-13 DIAGNOSIS — I10 Essential (primary) hypertension: Secondary | ICD-10-CM | POA: Diagnosis not present

## 2018-11-25 ENCOUNTER — Other Ambulatory Visit: Payer: Self-pay

## 2018-11-25 DIAGNOSIS — Z20822 Contact with and (suspected) exposure to covid-19: Secondary | ICD-10-CM

## 2018-11-26 DIAGNOSIS — Z6825 Body mass index (BMI) 25.0-25.9, adult: Secondary | ICD-10-CM | POA: Diagnosis not present

## 2018-11-26 DIAGNOSIS — N924 Excessive bleeding in the premenopausal period: Secondary | ICD-10-CM | POA: Diagnosis not present

## 2018-11-26 DIAGNOSIS — M549 Dorsalgia, unspecified: Secondary | ICD-10-CM | POA: Diagnosis not present

## 2018-11-26 LAB — NOVEL CORONAVIRUS, NAA: SARS-CoV-2, NAA: NOT DETECTED

## 2018-12-16 DIAGNOSIS — D229 Melanocytic nevi, unspecified: Secondary | ICD-10-CM | POA: Diagnosis not present

## 2018-12-16 DIAGNOSIS — C4491 Basal cell carcinoma of skin, unspecified: Secondary | ICD-10-CM | POA: Diagnosis not present

## 2019-07-08 DIAGNOSIS — Z1231 Encounter for screening mammogram for malignant neoplasm of breast: Secondary | ICD-10-CM | POA: Diagnosis not present

## 2019-08-07 ENCOUNTER — Ambulatory Visit (INDEPENDENT_AMBULATORY_CARE_PROVIDER_SITE_OTHER): Payer: BC Managed Care – PPO | Admitting: Nurse Practitioner

## 2019-08-07 ENCOUNTER — Encounter: Payer: Self-pay | Admitting: Nurse Practitioner

## 2019-08-07 ENCOUNTER — Other Ambulatory Visit: Payer: Self-pay

## 2019-08-07 VITALS — BP 120/74 | HR 63 | Temp 97.9°F | Ht 62.0 in | Wt 136.2 lb

## 2019-08-07 DIAGNOSIS — M5431 Sciatica, right side: Secondary | ICD-10-CM | POA: Diagnosis not present

## 2019-08-07 DIAGNOSIS — M5432 Sciatica, left side: Secondary | ICD-10-CM | POA: Diagnosis not present

## 2019-08-07 DIAGNOSIS — M5440 Lumbago with sciatica, unspecified side: Secondary | ICD-10-CM | POA: Diagnosis not present

## 2019-08-07 MED ORDER — NAPROXEN 500 MG PO TABS: 500.0000 mg | ORAL_TABLET | Freq: Two times a day (BID) | ORAL | 0 refills | Status: DC

## 2019-08-07 MED ORDER — CYCLOBENZAPRINE HCL 5 MG PO TABS: 5.0000 mg | ORAL_TABLET | Freq: Three times a day (TID) | ORAL | 0 refills | Status: DC | PRN

## 2019-08-07 NOTE — Progress Notes (Signed)
Acute Office Visit  Subjective:    Patient ID: [facility], female    DOB: Oct 04, 1970, 49 y.o.   MRN: [id number]  Chief Complaint  Patient presents with  . Leg Pain    Shooting pain across left thigh up to hip and buttocks down to toes    Patient is in today for Leg Pain  The incident occurred more than 1 week ago. Incident location: History of back injury 20 years ago. The pain is present in the left hip, left thigh, right hip and right thigh. The quality of the pain is described as shooting and aching. The pain is at a severity of 6/10. The pain is moderate. The pain has been worsening since onset. Associated symptoms comments: Shooting pain across the thigh up to hip and buttocks down to toes. Nothing aggravates the symptoms. She has tried heat and NSAIDs for the symptoms. The treatment provided no relief.    Past Medical History:  Diagnosis Date  . Anemia   . Anxiety   . Depression   . Diverticulosis   . GERD (gastroesophageal reflux disease)   . MVP (mitral valve prolapse)   . Syncope     Past Surgical History:  Procedure Laterality Date  . INDUCED ABORTION  2000  . TONSILLECTOMY      Family History  Problem Relation Age of Onset  . Syncope episode Sister   . Syncope episode Father   . Mitral valve prolapse Sister   . Heart failure Sister 53       Half sister  . Heart failure Unknown 47       Nephew (sudden death).    . Uterine cancer Mother   . Heart disease Sister        Half sister  . Colon cancer Neg Hx   . Esophageal cancer Neg Hx   . Rectal cancer Neg Hx   . Stomach cancer Neg Hx     Social History   Socioeconomic History  . Marital status: Single    Spouse name: Not on file  . Number of children: 0  . Years of education: Not on file  . Highest education level: Not on file  Occupational History  . Occupation: Scientist, product/process development  Tobacco Use  . Smoking status: Never Smoker  . Smokeless tobacco: Never Used  Vaping Use  . Vaping Use: Never used    Substance and Sexual Activity  . Alcohol use: Yes    Comment: Occassionally  . Drug use: No    Types: Marijuana  . Sexual activity: Not Currently    Birth control/protection: None  Other Topics Concern  . Not on file  Social History Narrative   Lives alone   Social Determinants of Health   Financial Resource Strain:   . Difficulty of Paying Living Expenses:   Food Insecurity:   . Worried About Charity fundraiser in the Last Year:   . Arboriculturist in the Last Year:   Transportation Needs:   . Film/video editor (Medical):   Marland Kitchen Lack of Transportation (Non-Medical):   Physical Activity:   . Days of Exercise per Week:   . Minutes of Exercise per Session:   Stress:   . Feeling of Stress :   Social Connections:   . Frequency of Communication with Friends and Family:   . Frequency of Social Gatherings with Friends and Family:   . Attends Religious Services:   . Active Member of Clubs or Organizations:   .  Attends Archivist Meetings:   Marland Kitchen Marital Status:   Intimate Partner Violence:   . Fear of Current or Ex-Partner:   . Emotionally Abused:   Marland Kitchen Physically Abused:   . Sexually Abused:     Outpatient Medications Prior to Visit  Medication Sig Dispense Refill  . atenolol (TENORMIN) 50 MG tablet Take 1 tablet (50 mg total) by mouth daily. 90 tablet 3  . NP THYROID 15 MG tablet Take 15 mg by mouth daily. ARMOUR  12   No facility-administered medications prior to visit.    No Known Allergies  Review of Systems  Constitutional: Negative.   HENT: Negative.   Eyes: Negative.   Respiratory: Negative.   Cardiovascular: Negative.   Gastrointestinal: Negative.   Genitourinary: Negative.   Musculoskeletal: Positive for back pain and myalgias.  Neurological: Negative.        Objective:    Physical Exam Constitutional:      Appearance: She is normal weight.  HENT:     Head: Normocephalic.  Cardiovascular:     Rate and Rhythm: Normal rate and regular  rhythm.     Pulses: Normal pulses.     Heart sounds: Normal heart sounds.  Pulmonary:     Effort: Pulmonary effort is normal.     Breath sounds: Normal breath sounds.  Abdominal:     General: Bowel sounds are normal.  Musculoskeletal:        General: Tenderness present.  Skin:    General: Skin is warm.  Neurological:     Mental Status: She is oriented to person, place, and time.     BP 120/74   Pulse 63   Temp 97.9 F (36.6 C) (Temporal)   Ht 5\' 2"  (1.575 m)   Wt 136 lb 3.2 oz (61.8 kg)   BMI 24.91 kg/m  Wt Readings from Last 3 Encounters:  08/07/19 136 lb 3.2 oz (61.8 kg)  10/15/18 142 lb (64.4 kg)  07/24/17 141 lb (64 kg)    Health Maintenance Due  Topic Date Due  . Hepatitis C Screening  Never done  . INFLUENZA VACCINE  08/02/2019    There are no preventive care reminders to display for this patient.   Lab Results  Component Value Date   TSH 4.902 (H) 07/29/2017   Lab Results  Component Value Date   WBC 8.8 04/09/2017   HGB 13.4 04/09/2017   HCT 40.5 04/09/2017   MCV 90 04/09/2017   PLT 259 04/09/2017   Lab Results  Component Value Date   NA 138 04/09/2017   K 4.0 04/09/2017   CO2 24 04/09/2017   GLUCOSE 80 04/09/2017   BUN 8 04/09/2017   CREATININE 0.55 (L) 04/09/2017   BILITOT 0.4 04/09/2017   ALKPHOS 61 04/09/2017   AST 32 04/09/2017   ALT 45 (H) 04/09/2017   PROT 6.8 04/09/2017   ALBUMIN 4.5 04/09/2017   CALCIUM 9.3 04/09/2017   Lab Results  Component Value Date   CHOL 189 04/09/2017   Lab Results  Component Value Date   HDL 44 04/09/2017   Lab Results  Component Value Date   LDLCALC 126 (H) 04/09/2017   Lab Results  Component Value Date   TRIG 94 04/09/2017   Lab Results  Component Value Date   CHOLHDL 4.3 04/09/2017   No results found for: HGBA1C     Assessment & Plan:   Problem List Items Addressed This Visit      Nervous and Auditory   Bilateral  sciatica - Primary   Relevant Medications   naproxen (NAPROSYN)  500 MG tablet   cyclobenzaprine (FLEXERIL) 5 MG tablet   Other Relevant Orders   Ambulatory referral to Orthopedic Surgery     Other   Acute bilateral low back pain with sciatica    Patient is a 49 year old female who presents to clinic for acute bilateral lower back pain with sciatica. Patient reports pain has worsened in the last 1 to 5 weeks. Patient had a history of lower back injury that caused her to have back pain with pain radiating to her right leg but recently pain started in her left leg starting from her left hip, shooting across thigh up to hip and buttocks down to her toes. Patient used 800 mg Motrin that provided moderate relief. Patient rates pain 6 out of 10 on the pain scale of 0-10. Provided education to patient with printed handouts given. Advised patient to start back strengthening exercises, Flexeril 5 mg started, naproxen 500 mg twice daily, Depo-Medrol shot given in office, ice application. Rx sent to pharmacy Advised patient to follow-up with worsening or unresolved symptoms.       Relevant Medications   naproxen (NAPROSYN) 500 MG tablet   cyclobenzaprine (FLEXERIL) 5 MG tablet       Meds ordered this encounter  Medications  . naproxen (NAPROSYN) 500 MG tablet    Sig: Take 1 tablet (500 mg total) by mouth 2 (two) times daily with a meal.    Dispense:  30 tablet    Refill:  0    Order Specific Question:   Supervising Provider    Answer:   Caryl Pina A [[id number]]  . cyclobenzaprine (FLEXERIL) 5 MG tablet    Sig: Take 1 tablet (5 mg total) by mouth 3 (three) times daily as needed for muscle spasms.    Dispense:  30 tablet    Refill:  0    Order Specific Question:   Supervising Provider    Answer:   Caryl Pina A [[id number]]     Ivy Lynn, NP

## 2019-08-07 NOTE — Patient Instructions (Addendum)
Acute bilateral low back pain with sciatica Patient is a 49 year old female who presents to clinic for acute bilateral lower back pain with sciatica. Patient reports pain has worsened in the last 1 to 5 weeks. Patient had a history of lower back injury that caused her to have back pain with pain radiating to her right leg but recently pain started in her left leg starting from her left hip, shooting across thigh up to hip and buttocks down to her toes. Patient used 800 mg Motrin that provided moderate relief. Patient rates pain 6 out of 10 on the pain scale of 0-10. Provided education to patient with printed handouts given. Advised patient to start back strengthening exercises, Flexeril 5 mg started, naproxen 500 mg twice daily, Depo-Medrol shot given in office, ice application. Rx sent to pharmacy Advised patient to follow-up with worsening or unresolved symptoms.    Back Exercises These exercises help to make your trunk and back strong. They also help to keep the lower back flexible. Doing these exercises can help to prevent back pain or lessen existing pain.  If you have back pain, try to do these exercises 2-3 times each day or as told by your doctor.  As you get better, do the exercises once each day. Repeat the exercises more often as told by your doctor.  To stop back pain from coming back, do the exercises once each day, or as told by your doctor. Exercises Single knee to chest Do these steps 3-5 times in a row for each leg: 1. Lie on your back on a firm bed or the floor with your legs stretched out. 2. Bring one knee to your chest. 3. Grab your knee or thigh with both hands and hold them it in place. 4. Pull on your knee until you feel a gentle stretch in your lower back or buttocks. 5. Keep doing the stretch for 10-30 seconds. 6. Slowly let go of your leg and straighten it. Pelvic tilt Do these steps 5-10 times in a row: 1. Lie on your back on a firm bed or the floor with your  legs stretched out. 2. Bend your knees so they point up to the ceiling. Your feet should be flat on the floor. 3. Tighten your lower belly (abdomen) muscles to press your lower back against the floor. This will make your tailbone point up to the ceiling instead of pointing down to your feet or the floor. 4. Stay in this position for 5-10 seconds while you gently tighten your muscles and breathe evenly. Cat-cow Do these steps until your lower back bends more easily: 1. Get on your hands and knees on a firm surface. Keep your hands under your shoulders, and keep your knees under your hips. You may put padding under your knees. 2. Let your head hang down toward your chest. Tighten (contract) the muscles in your belly. Point your tailbone toward the floor so your lower back becomes rounded like the back of a cat. 3. Stay in this position for 5 seconds. 4. Slowly lift your head. Let the muscles of your belly relax. Point your tailbone up toward the ceiling so your back forms a sagging arch like the back of a cow. 5. Stay in this position for 5 seconds.  Press-ups Do these steps 5-10 times in a row: 1. Lie on your belly (face-down) on the floor. 2. Place your hands near your head, about shoulder-width apart. 3. While you keep your back relaxed and keep your  hips on the floor, slowly straighten your arms to raise the top half of your body and lift your shoulders. Do not use your back muscles. You may change where you place your hands in order to make yourself more comfortable. 4. Stay in this position for 5 seconds. 5. Slowly return to lying flat on the floor.  Bridges Do these steps 10 times in a row: 1. Lie on your back on a firm surface. 2. Bend your knees so they point up to the ceiling. Your feet should be flat on the floor. Your arms should be flat at your sides, next to your body. 3. Tighten your butt muscles and lift your butt off the floor until your waist is almost as high as your knees.  If you do not feel the muscles working in your butt and the back of your thighs, slide your feet 1-2 inches farther away from your butt. 4. Stay in this position for 3-5 seconds. 5. Slowly lower your butt to the floor, and let your butt muscles relax. If this exercise is too easy, try doing it with your arms crossed over your chest. Belly crunches Do these steps 5-10 times in a row: 1. Lie on your back on a firm bed or the floor with your legs stretched out. 2. Bend your knees so they point up to the ceiling. Your feet should be flat on the floor. 3. Cross your arms over your chest. 4. Tip your chin a little bit toward your chest but do not bend your neck. 5. Tighten your belly muscles and slowly raise your chest just enough to lift your shoulder blades a tiny bit off of the floor. Avoid raising your body higher than that, because it can put too much stress on your low back. 6. Slowly lower your chest and your head to the floor. Back lifts Do these steps 5-10 times in a row: 1. Lie on your belly (face-down) with your arms at your sides, and rest your forehead on the floor. 2. Tighten the muscles in your legs and your butt. 3. Slowly lift your chest off of the floor while you keep your hips on the floor. Keep the back of your head in line with the curve in your back. Look at the floor while you do this. 4. Stay in this position for 3-5 seconds. 5. Slowly lower your chest and your face to the floor. Contact a doctor if:  Your back pain gets a lot worse when you do an exercise.  Your back pain does not get better 2 hours after you exercise. If you have any of these problems, stop doing the exercises. Do not do them again unless your doctor says it is okay. Get help right away if:  You have sudden, very bad back pain. If this happens, stop doing the exercises. Do not do them again unless your doctor says it is okay. This information is not intended to replace advice given to you by your  health care provider. Make sure you discuss any questions you have with your health care provider. Document Revised: 09/12/2017 Document Reviewed: 09/12/2017 Elsevier Patient Education  2020 [place]. Sciatica  Sciatica is pain, weakness, tingling, or loss of feeling (numbness) along the sciatic nerve. The sciatic nerve starts in the lower back and goes down the back of each leg. Sciatica usually goes away on its own or with treatment. Sometimes, sciatica may come back (recur). What are the causes? This condition happens when the  sciatic nerve is pinched or has pressure put on it. This may be the result of:  A disk in between the bones of the spine bulging out too far (herniated disk).  Changes in the spinal disks that occur with aging.  A condition that affects a muscle in the butt.  Extra bone growth near the sciatic nerve.  A break (fracture) of the area between your hip bones (pelvis).  Pregnancy.  Tumor. This is rare. What increases the risk? You are more likely to develop this condition if you:  Play sports that put pressure or stress on the spine.  Have poor strength and ease of movement (flexibility).  Have had a back injury in the past.  Have had back surgery.  Sit for long periods of time.  Do activities that involve bending or lifting over and over again.  Are very overweight (obese). What are the signs or symptoms? Symptoms can vary from mild to very bad. They may include:  Any of these problems in the lower back, leg, hip, or butt: ? Mild tingling, loss of feeling, or dull aches. ? Burning sensations. ? Sharp pains.  Loss of feeling in the back of the calf or the sole of the foot.  Leg weakness.  Very bad back pain that makes it hard to move. These symptoms may get worse when you cough, sneeze, or laugh. They may also get worse when you sit or stand for long periods of time. How is this treated? This condition often gets better without any  treatment. However, treatment may include:  Changing or cutting back on physical activity when you have pain.  Doing exercises and stretching.  Putting ice or heat on the affected area.  Medicines that help: ? To relieve pain and swelling. ? To relax your muscles.  Shots (injections) of medicines that help to relieve pain, irritation, and swelling.  Surgery. Follow these instructions at home: Medicines  Take over-the-counter and prescription medicines only as told by your doctor.  Ask your doctor if the medicine prescribed to you: ? Requires you to avoid driving or using heavy machinery. ? Can cause trouble pooping (constipation). You may need to take these steps to prevent or treat trouble pooping:  Drink enough fluids to keep your pee (urine) pale yellow.  Take over-the-counter or prescription medicines.  Eat foods that are high in fiber. These include beans, whole grains, and fresh fruits and vegetables.  Limit foods that are high in fat and sugar. These include fried or sweet foods. Managing pain      If told, put ice on the affected area. ? Put ice in a plastic bag. ? Place a towel between your skin and the bag. ? Leave the ice on for 20 minutes, 2-3 times a day.  If told, put heat on the affected area. Use the heat source that your doctor tells you to use, such as a moist heat pack or a heating pad. ? Place a towel between your skin and the heat source. ? Leave the heat on for 20-30 minutes. ? Remove the heat if your skin turns bright red. This is very important if you are unable to feel pain, heat, or cold. You may have a greater risk of getting burned. Activity   Return to your normal activities as told by your doctor. Ask your doctor what activities are safe for you.  Avoid activities that make your symptoms worse.  Take short rests during the day. ? When you rest  for a long time, do some physical activity or stretching between periods of rest. ? Avoid  sitting for a long time without moving. Get up and move around at least one time each hour.  Exercise and stretch regularly, as told by your doctor.  Do not lift anything that is heavier than 10 lb (4.5 kg) while you have symptoms of sciatica. ? Avoid lifting heavy things even when you do not have symptoms. ? Avoid lifting heavy things over and over.  When you lift objects, always lift in a way that is safe for your body. To do this, you should: ? Bend your knees. ? Keep the object close to your body. ? Avoid twisting. General instructions  Stay at a healthy weight.  Wear comfortable shoes that support your feet. Avoid wearing high heels.  Avoid sleeping on a mattress that is too soft or too hard. You might have less pain if you sleep on a mattress that is firm enough to support your back.  Keep all follow-up visits as told by your doctor. This is important. Contact a doctor if:  You have pain that: ? Wakes you up when you are sleeping. ? Gets worse when you lie down. ? Is worse than the pain you have had in the past. ? Lasts longer than 4 weeks.  You lose weight without trying. Get help right away if:  You cannot control when you pee (urinate) or poop (have a bowel movement).  You have weakness in any of these areas and it gets worse: ? Lower back. ? The area between your hip bones. ? Butt. ? Legs.  You have redness or swelling of your back.  You have a burning feeling when you pee. Summary  Sciatica is pain, weakness, tingling, or loss of feeling (numbness) along the sciatic nerve.  This condition happens when the sciatic nerve is pinched or has pressure put on it.  Sciatica can cause pain, tingling, or loss of feeling (numbness) in the lower back, legs, hips, and butt.  Treatment often includes rest, exercise, medicines, and putting ice or heat on the affected area. This information is not intended to replace advice given to you by your health care provider.  Make sure you discuss any questions you have with your health care provider. Document Revised: 01/06/2018 Document Reviewed: 01/06/2018 Elsevier Patient Education  [place].

## 2019-08-07 NOTE — Assessment & Plan Note (Signed)
Patient is a 49 year old female who presents to clinic for acute bilateral lower back pain with sciatica. Patient reports pain has worsened in the last 1 to 5 weeks. Patient had a history of lower back injury that caused her to have back pain with pain radiating to her right leg but recently pain started in her left leg starting from her left hip, shooting across thigh up to hip and buttocks down to her toes. Patient used 800 mg Motrin that provided moderate relief. Patient rates pain 6 out of 10 on the pain scale of 0-10. Provided education to patient with printed handouts given. Advised patient to start back strengthening exercises, Flexeril 5 mg started, naproxen 500 mg twice daily, Depo-Medrol shot given in office, ice application. Rx sent to pharmacy Advised patient to follow-up with worsening or unresolved symptoms.

## 2019-08-12 ENCOUNTER — Telehealth: Payer: Self-pay | Admitting: Nurse Practitioner

## 2019-08-12 NOTE — Telephone Encounter (Signed)
Pt has a referral for ortho and she told Je she did not want to go to [place]. She wants to go to Dr Kenton Kingfisher in [place] or in [place]. Please call back

## 2019-08-13 ENCOUNTER — Other Ambulatory Visit: Payer: Self-pay | Admitting: Nurse Practitioner

## 2019-08-13 DIAGNOSIS — M5440 Lumbago with sciatica, unspecified side: Secondary | ICD-10-CM

## 2019-08-13 NOTE — Telephone Encounter (Signed)
New referral ordered to [facility].

## 2019-09-08 DIAGNOSIS — M545 Low back pain: Secondary | ICD-10-CM | POA: Diagnosis not present

## 2019-09-08 DIAGNOSIS — M25552 Pain in left hip: Secondary | ICD-10-CM | POA: Diagnosis not present

## 2019-10-16 ENCOUNTER — Other Ambulatory Visit: Payer: Self-pay | Admitting: Cardiology

## 2019-10-20 ENCOUNTER — Other Ambulatory Visit: Payer: Self-pay | Admitting: Cardiology

## 2019-10-20 NOTE — Telephone Encounter (Signed)
*  STAT* If patient is at the pharmacy, call can be transferred to refill team.   1. Which medications need to be refilled? (please list name of each medication and dose if known) atenolol (TENORMIN) 50 MG tablet  2. Which pharmacy/location (including street and city if local pharmacy) is medication to be sent to?[place], [place]   3. Do they need a 30 day or 90 day supply? 90 day

## 2019-12-21 ENCOUNTER — Other Ambulatory Visit: Payer: Self-pay | Admitting: Cardiology

## 2019-12-28 ENCOUNTER — Other Ambulatory Visit: Payer: Self-pay | Admitting: Cardiology

## 2019-12-29 ENCOUNTER — Telehealth: Payer: Self-pay | Admitting: Cardiology

## 2019-12-29 MED ORDER — ATENOLOL 50 MG PO TABS: 50.0000 mg | ORAL_TABLET | Freq: Every day | ORAL | 1 refills | Status: DC

## 2019-12-29 NOTE — Addendum Note (Signed)
Addended by: Orlene Och on: 12/29/2019 02:49 PM   Modules accepted: Orders

## 2019-12-29 NOTE — Telephone Encounter (Signed)
*  STAT* If patient is at the pharmacy, call can be transferred to refill team.   1. Which medications need to be refilled? (please list name of each medication and dose if known) atenolol (TENORMIN) 50 MG tablet  2. Which pharmacy/location (including street and city if local pharmacy) is medication to be sent to? [facility] [address] [place] - [address]  3. Do they need a 30 day or 90 day supply? 90 day supply  Patient states she has been completely out of medication for 2 days, however, she scheduled an appointment for 01/27/20 with Dr. Antoine Poche.

## 2020-01-26 DIAGNOSIS — R55 Syncope and collapse: Secondary | ICD-10-CM

## 2020-01-26 HISTORY — DX: Syncope and collapse: R55

## 2020-01-26 NOTE — Progress Notes (Deleted)
Cardiology Office Note   Date:  01/26/2020   ID:  Sandra Powell, DOB May 22, 1970, MRN [id number]  PCP:  No primary care provider on file.  Cardiologist:   No primary care provider on file. Referring:  ***  No chief complaint on file.     History of Present Illness: Sandra Powell is a 50 y.o. female who presents for evaluation of MVP.  She has a previous history of syncope.  She an extensive work up in [place].   Since I last saw her ***   ***  she has had no new cardiovascular complaints. The patient denies any new symptoms such as chest discomfort, neck or arm discomfort. There has been no new shortness of breath, PND or orthopnea. There have been no reported palpitations, presyncope or syncope.  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   Past Medical History:  Diagnosis Date  . Anemia   . Anxiety   . Depression   . Diverticulosis   . GERD (gastroesophageal reflux disease)   . MVP (mitral valve prolapse)   . Syncope     Past Surgical History:  Procedure Laterality Date  . INDUCED ABORTION  2000  . TONSILLECTOMY       Current Outpatient Medications  Medication Sig Dispense Refill  . atenolol (TENORMIN) 50 MG tablet Take 1 tablet (50 mg total) by mouth daily. 30 tablet 1  . cyclobenzaprine (FLEXERIL) 5 MG tablet Take 1 tablet (5 mg total) by mouth 3 (three) times daily as needed for muscle spasms. 30 tablet 0  . naproxen (NAPROSYN) 500 MG tablet Take 1 tablet (500 mg total) by mouth 2 (two) times daily with a meal. 30 tablet 0  . NP THYROID 15 MG tablet Take 15 mg by mouth daily. ARMOUR  12   No current facility-administered medications for this visit.    Allergies:   Patient has no known allergies.    ROS:  Please see the history of present illness.   Otherwise, review of systems are positive for {NONE DEFAULTED:18576::"none"}.   All other systems are reviewed and negative.    PHYSICAL EXAM: VS:  There were no vitals taken for this visit. , BMI There  is no height or weight on file to calculate BMI. GENERAL:  Well appearing NECK:  No jugular venous distention, waveform within normal limits, carotid upstroke brisk and symmetric, no bruits, no thyromegaly LUNGS:  Clear to auscultation bilaterally CHEST:  Unremarkable HEART:  PMI not displaced or sustained,S1 and S2 within normal limits, no S3, no S4, no clicks, no rubs, *** murmurs ABD:  Flat, positive bowel sounds normal in frequency in pitch, no bruits, no rebound, no guarding, no midline pulsatile mass, no hepatomegaly, no splenomegaly EXT:  2 plus pulses throughout, no edema, no cyanosis no clubbing    ***GENERAL:  Well appearing HEENT:  Pupils equal round and reactive, fundi not visualized, oral mucosa unremarkable NECK:  No jugular venous distention, waveform within normal limits, carotid upstroke brisk and symmetric, no bruits, no thyromegaly LYMPHATICS:  No cervical, inguinal adenopathy LUNGS:  Clear to auscultation bilaterally BACK:  No CVA tenderness CHEST:  Unremarkable HEART:  PMI not displaced or sustained,S1 and S2 within normal limits, no S3, no S4, no clicks, no rubs, *** murmurs ABD:  Flat, positive bowel sounds normal in frequency in pitch, no bruits, no rebound, no guarding, no midline pulsatile mass, no hepatomegaly, no splenomegaly EXT:  2 plus pulses throughout, no edema, no cyanosis  no clubbing SKIN:  No rashes no nodules NEURO:  Cranial nerves II through XII grossly intact, motor grossly intact throughout PSYCH:  Cognitively intact, oriented to person place and time    EKG:  EKG {ACTION; IS/IS NOB:[id number]} ordered today. The ekg ordered today demonstrates ***   Recent Labs: No results found for requested labs within last 8760 hours.    Lipid Panel    Component Value Date/Time   CHOL 189 04/09/2017 1248   TRIG 94 04/09/2017 1248   TRIG 128 11/17/2012 1258   HDL 44 04/09/2017 1248   HDL 40 11/17/2012 1258   CHOLHDL 4.3 04/09/2017 1248   LDLCALC 126  (H) 04/09/2017 1248   LDLCALC 111 (H) 11/17/2012 1258      Wt Readings from Last 3 Encounters:  08/07/19 136 lb 3.2 oz (61.8 kg)  10/15/18 142 lb (64.4 kg)  07/24/17 141 lb (64 kg)      Other studies Reviewed: Additional studies/ records that were reviewed today include: ***. Review of the above records demonstrates:  Please see elsewhere in the note.  ***   ASSESSMENT AND PLAN:   SYNCOPE:   *** She has had no further episodes.  She had an extensive work-up.  No further work-up is indicated.   MVP:   *** She had non MVP noted on previous echo last year.  No change in therapy.  No further imaging.  ABNORMAL THYROID LABS:    ***  These were normal on follow up in August.  She is having this followed soon by her primary provider.    Current medicines are reviewed at length with the patient today.  The patient {ACTIONS; HAS/DOES NOT HAVE:19233} concerns regarding medicines.  The following changes have been made:  {PLAN; NO CHANGE:13088:s}  Labs/ tests ordered today include: *** No orders of the defined types were placed in this encounter.    Disposition:   FU with ***    Signed, Minus Breeding, MD  01/26/2020 9:36 PM    [place]

## 2020-01-27 ENCOUNTER — Ambulatory Visit: Payer: BC Managed Care – PPO | Admitting: Cardiology

## 2020-01-27 DIAGNOSIS — R55 Syncope and collapse: Secondary | ICD-10-CM

## 2020-01-27 DIAGNOSIS — I341 Nonrheumatic mitral (valve) prolapse: Secondary | ICD-10-CM

## 2020-02-23 NOTE — Progress Notes (Signed)
Cardiology Office Note   Date:  02/24/2020   ID:  Sandra Powell, DOB 07-18-1970, MRN [id number]  PCP:  Claretta Fraise, MD  Cardiologist:   No primary care provider on file.   Chief Complaint  Patient presents with  . Palpitations      History of Present Illness: Sandra Powell is a 50 y.o. female who presents for evaluation of MVP.  She has a previous history of syncope.  She an extensive work up in [place].   Since I last saw her she has done quite well.  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 The patient denies any new symptoms such as chest discomfort, neck or arm discomfort. There has been no new shortness of breath, PND or orthopnea. There have been no reported palpitations, presyncope or syncope.  She thinks she is done much better with her anxiety on the atenolol.    Past Medical History:  Diagnosis Date  . Anemia   . Anxiety   . Depression   . Diverticulosis   . GERD (gastroesophageal reflux disease)   . MVP (mitral valve prolapse)   . Syncope     Past Surgical History:  Procedure Laterality Date  . INDUCED ABORTION  2000  . TONSILLECTOMY       Current Outpatient Medications  Medication Sig Dispense Refill  . NP THYROID 15 MG tablet Take 15 mg by mouth daily. ARMOUR  12  . atenolol (TENORMIN) 50 MG tablet Take 1 tablet (50 mg total) by mouth daily. 90 tablet 3   No current facility-administered medications for this visit.    Allergies:   Patient has no known allergies.    ROS:  Please see the history of present illness.   Otherwise, review of systems are positive for none.   All other systems are reviewed and negative.    PHYSICAL EXAM: VS:  BP 138/84   Pulse 65   Ht 5\' 2"  (1.575 m)   Wt 135 lb (61.2 kg)   BMI 24.69 kg/m  , BMI Body mass index is 24.69 kg/m. GENERAL:  Well appearing NECK:  No jugular venous distention, waveform within normal limits, carotid upstroke brisk and symmetric, no bruits, no thyromegaly LUNGS:  Clear to auscultation bilaterally CHEST:   Unremarkable HEART:  PMI not displaced or sustained,S1 and S2 within normal limits, no S3, no S4, no clicks, no rubs, no murmurs ABD:  Flat, positive bowel sounds normal in frequency in pitch, no bruits, no rebound, no guarding, no midline pulsatile mass, no hepatomegaly, no splenomegaly EXT:  2 plus pulses throughout, no edema, no cyanosis no clubbing  EKG:  EKG is ordered today. The ekg ordered today demonstrates sinus rhythm, rate 65, axis within normal limits, intervals within normal limits, no acute ST-T wave changes.   Recent Labs: No results found for requested labs within last 8760 hours.    Lipid Panel    Component Value Date/Time   CHOL 189 04/09/2017 1248   TRIG 94 04/09/2017 1248   TRIG 128 11/17/2012 1258   HDL 44 04/09/2017 1248   HDL 40 11/17/2012 1258   CHOLHDL 4.3 04/09/2017 1248   LDLCALC 126 (H) 04/09/2017 1248   LDLCALC 111 (H) 11/17/2012 1258      Wt Readings from Last 3 Encounters:  02/24/20 135 lb (61.2 kg)  08/07/19 136 lb 3.2 oz (61.8 kg)  10/15/18 142 lb (64.4 kg)      Other studies Reviewed: Additional studies/ records that were reviewed today include: None. Review of the  above records demonstrates:  NA   ASSESSMENT AND PLAN:   SYNCOPE:    She had no further syncope.  No change in therapy.   MVP:   She has had no new palpitations or other symptoms.  She is done quite well on the atenolol.  She will continue with this therapy.     Current medicines are reviewed at length with the patient today.  The patient does not have concerns regarding medicines.  The following changes have been made:  no change  Labs/ tests ordered today include: none  Orders Placed This Encounter  Procedures  . EKG 12-Lead     Disposition:   FU with me in one year.     Signed, Minus Breeding, MD  02/24/2020 5:01 PM    [facility]

## 2020-02-24 ENCOUNTER — Ambulatory Visit: Payer: BC Managed Care – PPO | Admitting: Cardiology

## 2020-02-24 ENCOUNTER — Encounter: Payer: Self-pay | Admitting: Cardiology

## 2020-02-24 ENCOUNTER — Other Ambulatory Visit: Payer: Self-pay

## 2020-02-24 VITALS — BP 138/84 | HR 65 | Ht 62.0 in | Wt 135.0 lb

## 2020-02-24 DIAGNOSIS — I341 Nonrheumatic mitral (valve) prolapse: Secondary | ICD-10-CM

## 2020-02-24 DIAGNOSIS — R55 Syncope and collapse: Secondary | ICD-10-CM

## 2020-02-24 MED ORDER — ATENOLOL 50 MG PO TABS: 50.0000 mg | ORAL_TABLET | Freq: Every day | ORAL | 3 refills | Status: DC

## 2020-02-24 NOTE — Patient Instructions (Signed)
Medication Instructions:  The current medical regimen is effective;  continue present plan and medications.  *If you need a refill on your cardiac medications before your next appointment, please call your pharmacy*  Follow-Up: At [facility], you and your health needs are our priority.  As part of our continuing mission to provide you with exceptional heart care, we have created designated Provider Care Teams.  These Care Teams include your primary Cardiologist (physician) and Advanced Practice Providers (APPs -  Physician Assistants and Nurse Practitioners) who all work together to provide you with the care you need, when you need it.  We recommend signing up for the patient portal called "MyChart".  Sign up information is provided on this After Visit Summary.  MyChart is used to connect with patients for Virtual Visits (Telemedicine).  Patients are able to view lab/test results, encounter notes, upcoming appointments, etc.  Non-urgent messages can be sent to your provider as well.   To learn more about what you can do with MyChart, go to [email or web address].    Your next appointment:   12 month(s)  The format for your next appointment:   In Person  Provider:   James Hochrein, MD   Thank you for choosing [facility]!!     

## 2020-04-07 DIAGNOSIS — Z6824 Body mass index (BMI) 24.0-24.9, adult: Secondary | ICD-10-CM | POA: Diagnosis not present

## 2020-04-07 DIAGNOSIS — K625 Hemorrhage of anus and rectum: Secondary | ICD-10-CM | POA: Diagnosis not present

## 2020-04-07 DIAGNOSIS — Z01419 Encounter for gynecological examination (general) (routine) without abnormal findings: Secondary | ICD-10-CM | POA: Diagnosis not present

## 2020-04-13 ENCOUNTER — Encounter: Payer: Self-pay | Admitting: Internal Medicine

## 2020-05-12 ENCOUNTER — Encounter: Payer: Self-pay | Admitting: Internal Medicine

## 2020-05-12 ENCOUNTER — Ambulatory Visit: Payer: BC Managed Care – PPO | Admitting: Internal Medicine

## 2020-05-12 VITALS — BP 128/80 | HR 85 | Ht 62.0 in | Wt 134.8 lb

## 2020-05-12 DIAGNOSIS — K2100 Gastro-esophageal reflux disease with esophagitis, without bleeding: Secondary | ICD-10-CM

## 2020-05-12 DIAGNOSIS — K625 Hemorrhage of anus and rectum: Secondary | ICD-10-CM

## 2020-05-12 MED ORDER — PLENVU 140 G PO SOLR: 1.0000 | Freq: Once | ORAL | 0 refills | Status: AC

## 2020-05-12 NOTE — Progress Notes (Signed)
HISTORY OF PRESENT ILLNESS:  Sandra Powell is a 50 y.o. female, professional housecleaner, who presents today regarding rectal bleeding.  Patient was last seen in this office 2015 for GERD and constipation.  In September 2015 she underwent complete colonoscopy which revealed moderate left-sided diverticulosis and internal hemorrhoids.  Upper endoscopy revealed mild esophagitis but was otherwise normal.  Patient tells me over the past 2 years she has had intermittent rectal bleeding.  This occurs about once every 1 to 2 weeks.  Typically blood on the tissue paper when wiping.  Occasionally blood on the undergarments after walking.  She does strain to have bowel movements.  She denies rectal pain.  She denies abdominal pain.  No Weight loss.  No family colon cancer.  She is concerned.  REVIEW OF SYSTEMS:  All non-GI ROS negative unless otherwise stated in HPI except for headaches, anxiety, back pain, depression, confusion, visual change, night sweats, sinus and allergies  Past Medical History:  Diagnosis Date  . Anemia   . Anxiety   . Depression   . Diverticulitis   . Diverticulosis   . GERD (gastroesophageal reflux disease)   . Hypertension   . MVP (mitral valve prolapse)   . Syncope   . Thyroid disease     Past Surgical History:  Procedure Laterality Date  . ENDOMETRIAL ABLATION    . INDUCED ABORTION  2000  . TONSILLECTOMY      Social History Sandra Powell  reports that she has never smoked. She has never used smokeless tobacco. She reports current alcohol use. She reports current drug use. Frequency: 7.00 times per week. Drug: Marijuana.  family history includes Heart disease in her sister; Heart failure (age of onset: 49) in an other family member; Heart failure (age of onset: 51) in her sister; Mitral valve prolapse in her sister; Syncope episode in her father and sister; Uterine cancer in her mother.  No Known Allergies     PHYSICAL EXAMINATION: Vital signs: BP 128/80    Pulse 85   Ht 5\' 2"  (1.575 m)   Wt 134 lb 12.8 oz (61.1 kg)   SpO2 98%   BMI 24.66 kg/m   Constitutional: generally well-appearing, no acute distress Psychiatric: alert and oriented x3, cooperative Eyes: extraocular movements intact, anicteric, conjunctiva pink Mouth: oral pharynx moist, no lesions Neck: supple no lymphadenopathy Cardiovascular: heart regular rate and rhythm, no murmur Lungs: clear to auscultation bilaterally Abdomen: soft, nontender, nondistended, no obvious ascites, no peritoneal signs, normal bowel sounds, no organomegaly Rectal: Deferred until colonoscopy Extremities: no clubbing, cyanosis, or lower extremity edema bilaterally Skin: no lesions on visible extremities Neuro: No focal deficits.  Cranial nerves intact  ASSESSMENT:  1.  Chronic intermittent rectal bleeding.  Needs evaluated. 2.  Colonoscopy 2015 with diverticulosis and internal hemorrhoids. 3.  History of GERD.  GERD upper endoscopy with mild esophagitis.  Occasional symptoms.  On-demand antacid therapy  PLAN:  1.  Schedule colonoscopy.The nature of the procedure, as well as the risks, benefits, and alternatives were carefully and thoroughly reviewed with the patient. Ample time for discussion and questions allowed. The patient understood, was satisfied, and agreed to proceed. 2.  Reflux precautions 3.  OTC acid suppressive therapy on demand

## 2020-05-12 NOTE — Patient Instructions (Signed)
If you are age 50 or older, your body mass index should be between 23-30. Your Body mass index is 24.66 kg/m. If this is out of the aforementioned range listed, please consider follow up with your Primary Care Provider.  If you are age 68 or younger, your body mass index should be between 19-25. Your Body mass index is 24.66 kg/m. If this is out of the aformentioned range listed, please consider follow up with your Primary Care Provider.   You have been scheduled for a colonoscopy. Please follow written instructions given to you at your visit today.  Please pick up your prep supplies at the pharmacy within the next 1-3 days. If you use inhalers (even only as needed), please bring them with you on the day of your procedure.

## 2020-07-08 DIAGNOSIS — Z1231 Encounter for screening mammogram for malignant neoplasm of breast: Secondary | ICD-10-CM | POA: Diagnosis not present

## 2020-08-02 ENCOUNTER — Encounter: Payer: Self-pay | Admitting: Internal Medicine

## 2020-08-02 ENCOUNTER — Ambulatory Visit (AMBULATORY_SURGERY_CENTER): Payer: BC Managed Care – PPO | Admitting: Internal Medicine

## 2020-08-02 ENCOUNTER — Other Ambulatory Visit: Payer: Self-pay

## 2020-08-02 VITALS — BP 129/77 | HR 68 | Temp 99.1°F | Resp 13 | Ht 62.0 in | Wt 134.0 lb

## 2020-08-02 DIAGNOSIS — K5730 Diverticulosis of large intestine without perforation or abscess without bleeding: Secondary | ICD-10-CM | POA: Diagnosis not present

## 2020-08-02 DIAGNOSIS — Z1211 Encounter for screening for malignant neoplasm of colon: Secondary | ICD-10-CM

## 2020-08-02 DIAGNOSIS — K625 Hemorrhage of anus and rectum: Secondary | ICD-10-CM | POA: Diagnosis not present

## 2020-08-02 DIAGNOSIS — D122 Benign neoplasm of ascending colon: Secondary | ICD-10-CM | POA: Diagnosis not present

## 2020-08-02 DIAGNOSIS — K648 Other hemorrhoids: Secondary | ICD-10-CM

## 2020-08-02 MED ORDER — SODIUM CHLORIDE 0.9 % IV SOLN: 500.0000 mL | Freq: Once | INTRAVENOUS | Status: AC

## 2020-08-02 NOTE — Progress Notes (Signed)
PT taken to PACU. Monitors in place. VSS. Report given to RN. 

## 2020-08-02 NOTE — Progress Notes (Signed)
Called to room to assist during endoscopic procedure.  Patient ID and intended procedure confirmed with present staff. Received instructions for my participation in the procedure from the performing physician.  

## 2020-08-02 NOTE — Progress Notes (Signed)
Pt states she uses marijuana daily for anxiety.  Last time used was yesterday (08/01/20).

## 2020-08-02 NOTE — Patient Instructions (Signed)
Discharge instructions given. Handouts on polyps,diverticulosis and hemorrhoids. Resume previous medications. YOU HAD AN ENDOSCOPIC PROCEDURE TODAY AT [facility]:   Refer to the procedure report that was given to you for any specific questions about what was found during the examination.  If the procedure report does not answer your questions, please call your gastroenterologist to clarify.  If you requested that your care partner not be given the details of your procedure findings, then the procedure report has been included in a sealed envelope for you to review at your convenience later.  YOU SHOULD EXPECT: Some feelings of bloating in the abdomen. Passage of more gas than usual.  Walking can help get rid of the air that was put into your GI tract during the procedure and reduce the bloating. If you had a lower endoscopy (such as a colonoscopy or flexible sigmoidoscopy) you may notice spotting of blood in your stool or on the toilet paper. If you underwent a bowel prep for your procedure, you may not have a normal bowel movement for a few days.  Please Note:  You might notice some irritation and congestion in your nose or some drainage.  This is from the oxygen used during your procedure.  There is no need for concern and it should clear up in a day or so.  SYMPTOMS TO REPORT IMMEDIATELY:  Following lower endoscopy (colonoscopy or flexible sigmoidoscopy):  Excessive amounts of blood in the stool  Significant tenderness or worsening of abdominal pains  Swelling of the abdomen that is new, acute  Fever of 100F or higher   For urgent or emergent issues, a gastroenterologist can be reached at any hour by calling [phone]. Do not use MyChart messaging for urgent concerns.    DIET:  We do recommend a small meal at first, but then you may proceed to your regular diet.  Drink plenty of fluids but you should avoid alcoholic beverages for 24 hours.  ACTIVITY:  You should  plan to take it easy for the rest of today and you should NOT DRIVE or use heavy machinery until tomorrow (because of the sedation medicines used during the test).    FOLLOW UP: Our staff will call the number listed on your records 48-72 hours following your procedure to check on you and address any questions or concerns that you may have regarding the information given to you following your procedure. If we do not reach you, we will leave a message.  We will attempt to reach you two times.  During this call, we will ask if you have developed any symptoms of COVID 19. If you develop any symptoms (ie: fever, flu-like symptoms, shortness of breath, cough etc.) before then, please call [phone].  If you test positive for Covid 19 in the 2 weeks post procedure, please call and report this information to [place].    If any biopsies were taken you will be contacted by phone or by letter within the next 1-3 weeks.  Please call us at [phone] if you have not heard about the biopsies in 3 weeks.    SIGNATURES/CONFIDENTIALITY: You and/or your care partner have signed paperwork which will be entered into your electronic medical record.  These signatures attest to the fact that that the information above on your After Visit Summary has been reviewed and is understood.  Full responsibility of the confidentiality of this discharge information lies with you and/or your care-partner.

## 2020-08-02 NOTE — Op Note (Signed)
[place] Patient Name: Sandra Powell Procedure Date: 08/02/2020 11:41 AM MRN: [id number] Endoscopist: Docia Chuck. Henrene Pastor , MD Age: 50 Referring MD:  Date of Birth: [birth year] Gender: Female Account #: [id number] Procedure:                Colonoscopy with cold snare polypectomy x 2 Indications:              Screening for colorectal malignant neoplasm.                            Reports intermittent minor rectal bleeding.                            Previous examination 2015 was negative for neoplasia Medicines:                Monitored Anesthesia Care Procedure:                Pre-Anesthesia Assessment:                           - Prior to the procedure, a History and Physical                            was performed, and patient medications and                            allergies were reviewed. The patient's tolerance of                            previous anesthesia was also reviewed. The risks                            and benefits of the procedure and the sedation                            options and risks were discussed with the patient.                            All questions were answered, and informed consent                            was obtained. Prior Anticoagulants: The patient has                            taken no previous anticoagulant or antiplatelet                            agents. ASA Grade Assessment: II - A patient with                            mild systemic disease. After reviewing the risks                            and benefits, the patient was deemed in  satisfactory condition to undergo the procedure.                           After obtaining informed consent, the colonoscope                            was passed under direct vision. Throughout the                            procedure, the patient's blood pressure, pulse, and                            oxygen saturations were monitored continuously. The                             CF [id number] was introduced through the anus                            and advanced to the the cecum, identified by                            appendiceal orifice and ileocecal valve. The                            ileocecal valve, appendiceal orifice, and rectum                            were photographed. The quality of the bowel                            preparation was excellent. The colonoscopy was                            performed without difficulty. The patient tolerated                            the procedure well. The bowel preparation used was                            SUPREP via split dose instruction. Scope In: 11:59:10 AM Scope Out: 12:17:38 PM Scope Withdrawal Time: 0 hours 15 minutes 43 seconds  Total Procedure Duration: 0 hours 18 minutes 28 seconds  Findings:                 Two polyps were found in the ascending colon. The                            polyps were 3 to 4 mm in size. These polyps were                            removed with a cold snare. Resection and retrieval                            were complete.  Multiple diverticula were found in the left colon                            and right colon.                           Internal hemorrhoids were found during                            retroflexion. The hemorrhoids were moderate.                           The exam was otherwise without abnormality on                            direct and retroflexion views. Complications:            No immediate complications. Estimated blood loss:                            None. Estimated Blood Loss:     Estimated blood loss: none. Impression:               - Two 3 to 4 mm polyps in the ascending colon,                            removed with a cold snare. Resected and retrieved.                           - Diverticulosis in the left colon and in the right                            colon.                           -  Internal hemorrhoids.                           - The examination was otherwise normal on direct                            and retroflexion views. Recommendation:           - Repeat colonoscopy in 7 years for surveillance.                           - Patient has a contact number available for                            emergencies. The signs and symptoms of potential                            delayed complications were discussed with the                            patient. Return to normal activities tomorrow.  Written discharge instructions were provided to the                            patient.                           - Resume previous diet.                           - Continue present medications.                           - Await pathology results.                           - Metamucil 2 heaping tablespoons daily in 16                            ounces of water. This will help constipation and                            hemorrhoids. Docia Chuck. Henrene Pastor, MD 08/02/2020 12:26:19 PM This report has been signed electronically.

## 2020-08-04 ENCOUNTER — Telehealth: Payer: Self-pay

## 2020-08-04 ENCOUNTER — Encounter: Payer: Self-pay | Admitting: Internal Medicine

## 2020-08-04 NOTE — Telephone Encounter (Signed)
  Follow up Call-  Call back number 08/02/2020  Post procedure Call Back phone  # [phone]  Permission to leave phone message Yes  Some recent data might be hidden     Patient questions:  Do you have a fever, pain , or abdominal swelling? No. Pain Score  0 *  Have you tolerated food without any problems? Yes.    Have you been able to return to your normal activities? Yes.    Do you have any questions about your discharge instructions: Diet   No. Medications  No. Follow up visit  No.  Do you have questions or concerns about your Care? No.  Actions: * If pain score is 4 or above: No action needed, pain <4. Have you developed a fever since your procedure? no  2.   Have you had an respiratory symptoms (SOB or cough) since your procedure? no  3.   Have you tested positive for COVID 19 since your procedure no  4.   Have you had any family members/close contacts diagnosed with the COVID 19 since your procedure?  no   If yes to any of these questions please route to Joylene John, RN and Joella Prince, RN

## 2021-02-24 ENCOUNTER — Other Ambulatory Visit: Payer: Self-pay | Admitting: Cardiology

## 2021-02-26 NOTE — Progress Notes (Signed)
?  ?Cardiology Office Note ? ? ?Date:  03/01/2021  ? ?ID:  Sandra Powell, DOB January 21, 1970, MRN [id number] ? ?PCP:  Claretta Fraise, MD  ?Cardiologist:   None ? ? ?Chief Complaint  ?Patient presents with  ? Mitral Valve Prolapse  ? ? ?  ?History of Present Illness: ?Sandra Powell is a 51 y.o. female who presents for evaluation of MVP.  She has a previous history of syncope.  She an extensive work up in [place].   Since I last saw her she has done well.  She does occasionally get some fleeting chest discomfort or maybe some chest discomfort when she is emotionally stressed.  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  When she is physically active she feels fine.   She does not have any shortness of breath, PND or orthopnea.  She does not notice any palpitations, presyncope or syncope.  She has had no weight gain or edema. ? ? ?Past Medical History:  ?Diagnosis Date  ? Allergy   ? Anemia   ? Anxiety   ? Arthritis   ? Depression   ? Diverticulitis   ? Diverticulosis   ? GERD (gastroesophageal reflux disease)   ? Hypertension   ? MVP (mitral valve prolapse)   ? Syncope   ? Thyroid disease   ? ? ?Past Surgical History:  ?Procedure Laterality Date  ? ENDOMETRIAL ABLATION    ? INDUCED ABORTION  2000  ? TONSILLECTOMY    ? ? ? ?Current Outpatient Medications  ?Medication Sig Dispense Refill  ? atenolol (TENORMIN) 50 MG tablet Take 1 tablet by mouth once daily 90 tablet 3  ? MULTIPLE VITAMIN PO Take 1 tablet by mouth daily.    ? NP THYROID 15 MG tablet Take 15 mg by mouth daily. ARMOUR  12  ? ?Current Facility-Administered Medications  ?Medication Dose Route Frequency Provider Last Rate Last Admin  ? 0.9 %  sodium chloride infusion  500 mL Intravenous Once Irene Shipper, MD      ? ? ?Allergies:   Patient has no known allergies.  ? ? ?ROS:  Please see the history of present illness.   Otherwise, review of systems are positive for none.   All other systems are reviewed and negative.  ? ? ?PHYSICAL EXAM: ?VS:   BP 128/84   Pulse 74   Ht 5\' 2"  (1.575 m)   Wt 137 lb (62.1 kg)   BMI 25.06 kg/m?  , BMI Body mass index is 25.06 kg/m?. ?GENERAL:  Well appearing ?NECK:  No jugular venous distention, waveform within normal limits, carotid upstroke brisk and symmetric, no bruits, no thyromegaly ?LUNGS:  Clear to auscultation bilaterally ?CHEST:  Unremarkable ?HEART:  PMI not displaced or sustained,S1 and S2 within normal limits, no S3, no S4, no clicks, no rubs, brief soft apical murmur heard only with the patient in the left lateral recumbent position, no diastolic murmurs ?ABD:  Flat, positive bowel sounds normal in frequency in pitch, no bruits, no rebound, no guarding, no midline pulsatile mass, no hepatomegaly, no splenomegaly ?EXT:  2 plus pulses throughout, no edema, no cyanosis no clubbing ? ?EKG:  EKG is  ordered today. ?The ekg ordered today demonstrates sinus rhythm, rate 74, axis within normal limits, intervals within normal limits, no acute ST-T wave changes. ? ? ?Recent Labs: ?No results found for requested labs within last 8760 hours.  ? ? ?Lipid Panel ?   ?Component Value Date/Time  ? CHOL  189 04/09/2017 1248  ? TRIG 94 04/09/2017 1248  ? TRIG 128 11/17/2012 1258  ? HDL 44 04/09/2017 1248  ? HDL 40 11/17/2012 1258  ? CHOLHDL 4.3 04/09/2017 1248  ? LDLCALC 126 (H) 04/09/2017 1248  ? LDLCALC 111 (H) 11/17/2012 1258  ? ?  ? ?Wt Readings from Last 3 Encounters:  ?03/01/21 137 lb (62.1 kg)  ?08/02/20 134 lb (60.8 kg)  ?05/12/20 134 lb 12.8 oz (61.1 kg)  ?  ? ? ?Other studies Reviewed: ?Additional studies/ records that were reviewed today include: None. ?Review of the above records demonstrates: N/A ? ? ?ASSESSMENT AND PLAN: ? ? ?SYNCOPE:   She has had no further syncope.  No change in therapy. ?  ?MVP:    She has no change in her exam.  She did have some mild MR in 2019 and I will repeat an echo next year as it would have been 5 years.  ? ? ?Current medicines are reviewed at length with the patient today.  The  patient does not have concerns regarding medicines. ? ?The following changes have been made: None ? ?Labs/ tests ordered today include: None ? ?Orders Placed This Encounter  ?Procedures  ? EKG 12-Lead  ? ? ? ?Disposition:   FU with me in 1 year.   ? ? ?Signed, ?Minus Breeding, MD  ?03/01/2021 7:43 PM    ?[place] ? ? ? ?

## 2021-03-01 ENCOUNTER — Ambulatory Visit (INDEPENDENT_AMBULATORY_CARE_PROVIDER_SITE_OTHER): Payer: BC Managed Care – PPO | Admitting: Cardiology

## 2021-03-01 ENCOUNTER — Encounter: Payer: Self-pay | Admitting: Cardiology

## 2021-03-01 VITALS — BP 128/84 | HR 74 | Ht 62.0 in | Wt 137.0 lb

## 2021-03-01 DIAGNOSIS — I341 Nonrheumatic mitral (valve) prolapse: Secondary | ICD-10-CM

## 2021-03-01 DIAGNOSIS — R55 Syncope and collapse: Secondary | ICD-10-CM | POA: Diagnosis not present

## 2021-03-01 NOTE — Patient Instructions (Signed)
Medication Instructions:  ?The current medical regimen is effective;  continue present plan and medications. ? ?*If you need a refill on your cardiac medications before your next appointment, please call your pharmacy* ? ?Follow-Up: ?At [facility], you and your health needs are our priority.  As part of our continuing mission to provide you with exceptional heart care, we have created designated Provider Care Teams.  These Care Teams include your primary Cardiologist (physician) and Advanced Practice Providers (APPs -  Physician Assistants and Nurse Practitioners) who all work together to provide you with the care you need, when you need it. ? ?We recommend signing up for the patient portal called "MyChart".  Sign up information is provided on this After Visit Summary.  MyChart is used to connect with patients for Virtual Visits (Telemedicine).  Patients are able to view lab/test results, encounter notes, upcoming appointments, etc.  Non-urgent messages can be sent to your provider as well.   ?To learn more about what you can do with MyChart, go to [email or web address].   ? ?Your next appointment:   ?1 year(s) ? ?The format for your next appointment:   ?In Person ? ?Provider:   ?Dr Percival Spanish ? ? ?Thank you for choosing [facility]!! ? ? ? ?

## 2021-04-13 ENCOUNTER — Ambulatory Visit: Payer: BC Managed Care – PPO | Admitting: Family Medicine

## 2021-04-26 ENCOUNTER — Encounter: Payer: Self-pay | Admitting: Family Medicine

## 2021-04-26 ENCOUNTER — Encounter: Payer: BC Managed Care – PPO | Admitting: Family Medicine

## 2021-04-27 ENCOUNTER — Encounter: Payer: Self-pay | Admitting: Family Medicine

## 2021-04-27 NOTE — Progress Notes (Signed)
Erroneous encounter

## 2021-05-08 ENCOUNTER — Ambulatory Visit (INDEPENDENT_AMBULATORY_CARE_PROVIDER_SITE_OTHER): Payer: BC Managed Care – PPO

## 2021-05-08 ENCOUNTER — Encounter: Payer: Self-pay | Admitting: Family Medicine

## 2021-05-08 ENCOUNTER — Ambulatory Visit (INDEPENDENT_AMBULATORY_CARE_PROVIDER_SITE_OTHER): Payer: BC Managed Care – PPO | Admitting: Family Medicine

## 2021-05-08 VITALS — BP 136/84 | HR 70 | Temp 97.5°F | Ht 62.0 in | Wt 141.0 lb

## 2021-05-08 DIAGNOSIS — K649 Unspecified hemorrhoids: Secondary | ICD-10-CM

## 2021-05-08 DIAGNOSIS — R0602 Shortness of breath: Secondary | ICD-10-CM | POA: Diagnosis not present

## 2021-05-08 DIAGNOSIS — F418 Other specified anxiety disorders: Secondary | ICD-10-CM

## 2021-05-08 DIAGNOSIS — E039 Hypothyroidism, unspecified: Secondary | ICD-10-CM

## 2021-05-08 DIAGNOSIS — E559 Vitamin D deficiency, unspecified: Secondary | ICD-10-CM

## 2021-05-08 DIAGNOSIS — R0609 Other forms of dyspnea: Secondary | ICD-10-CM | POA: Diagnosis not present

## 2021-05-08 DIAGNOSIS — R601 Generalized edema: Secondary | ICD-10-CM

## 2021-05-08 DIAGNOSIS — M2550 Pain in unspecified joint: Secondary | ICD-10-CM

## 2021-05-08 DIAGNOSIS — F339 Major depressive disorder, recurrent, unspecified: Secondary | ICD-10-CM

## 2021-05-08 DIAGNOSIS — R059 Cough, unspecified: Secondary | ICD-10-CM | POA: Diagnosis not present

## 2021-05-08 DIAGNOSIS — R232 Flushing: Secondary | ICD-10-CM | POA: Diagnosis not present

## 2021-05-08 MED ORDER — HYDROCORTISONE ACETATE 25 MG RE SUPP: 25.0000 mg | Freq: Four times a day (QID) | RECTAL | 5 refills | Status: DC | PRN

## 2021-05-08 MED ORDER — DULOXETINE HCL 30 MG PO CPEP: 30.0000 mg | ORAL_CAPSULE | Freq: Every day | ORAL | 0 refills | Status: DC

## 2021-05-08 MED ORDER — PSYLLIUM 30.9 % PO POWD: ORAL | 11 refills | Status: DC

## 2021-05-08 NOTE — Addendum Note (Signed)
Addended by: Claretta Fraise on: 05/08/2021 03:03 PM ? ? Modules accepted: Orders ? ?

## 2021-05-08 NOTE — Progress Notes (Addendum)
? ?Subjective:  ?Patient ID: Sandra Powell, female    DOB: [birth year]  Age: 51 y.o. MRN: [id number] ? ?CC: Establish Care ? ? ?HPI ?Sandra Powell presents for swelling. " Anywhere it bends. Hands locking up. Does a lot of cleaning. Feels like she's been beaten. Back pain interfering work. Hurt so much that she has to sit down. Gets short of breath. Increasing in severity and frequency.  ? ? follow-up on  thyroid. The patient has a history of hypothyroidism for many years. It has been stable recently. Pt. denies any change in  voice, loss of hair, heat or cold intolerance. Energy level has been adequate to good. Patient denies constipation and diarrhea. No myxedema. Medication is as noted below. Verified that pt is taking it daily on an empty stomach. Well tolerated. ? ?Frequent hot flashes. INterfere with sleep. Feels like she is suffocating. Smokes marijuana. Uses it for anxiety. No family here. From [place], [place]. Near [place], [place].  ? ?Hemorhioids bleed, but don't cause pain. Bleed when she bends. ? ? ?  05/08/2021  ?  1:11 PM 05/08/2021  ?  1:05 PM 04/26/2021  ?  3:59 PM  ?Depression screen PHQ 2/9  ?Decreased Interest 2 0 2  ?Down, Depressed, Hopeless 2 0 3  ?PHQ - 2 Score 4 0 5  ?Altered sleeping 2  3  ?Tired, decreased energy 3  3  ?Change in appetite 3  3  ?Feeling bad or failure about yourself  2  2  ?Trouble concentrating 2  2  ?Moving slowly or fidgety/restless 1  0  ?Suicidal thoughts 2  2  ?PHQ-9 Score 19  20  ?Difficult doing work/chores Somewhat difficult  Somewhat difficult  ? ? ?  05/08/2021  ?  1:13 PM 04/26/2021  ?  4:01 PM  ?GAD 7 : Generalized Anxiety Score  ?Nervous, Anxious, on Edge 2 3  ?Control/stop worrying 2 3  ?Worry too much - different things 2 3  ?Trouble relaxing 2 2  ?Restless 2 2  ?Easily annoyed or irritable 2 2  ?Afraid - awful might happen 1 2  ?Total GAD 7 Score 13 17  ?Anxiety Difficulty Somewhat difficult   ? ? ? ? ?History ?Sandra Powell has a past medical history of Allergy, Anemia,  Anxiety, Arthritis, Depression, Diverticulitis, Diverticulosis, GERD (gastroesophageal reflux disease), Hypertension, MVP (mitral valve prolapse), Syncope, and Thyroid disease.  ? ?She has a past surgical history that includes Tonsillectomy; Induced abortion (2000); and Endometrial ablation.  ? ?Her family history includes Heart disease in her sister; Heart failure (age of onset: 82) in an other family member; Heart failure (age of onset: 4) in her sister; Mitral valve prolapse in her sister; Syncope episode in her father and sister; Uterine cancer in her mother.She reports that she has never smoked. She has been exposed to tobacco smoke. She has never used smokeless tobacco. She reports that she does not currently use alcohol. She reports current drug use. Frequency: 7.00 times per week. Drug: Marijuana. ? ? ? ?ROS ?Review of Systems  ?Constitutional: Negative.   ?HENT:  Negative for congestion.   ?Eyes:  Negative for visual disturbance.  ?Respiratory:  Negative for shortness of breath.   ?Cardiovascular:  Negative for chest pain.  ?Gastrointestinal:  Negative for abdominal pain, constipation, diarrhea, nausea and vomiting.  ?Endocrine: Positive for heat intolerance. Negative for polyuria.  ?Genitourinary:  Negative for difficulty urinating.  ?Musculoskeletal:  Negative for arthralgias and myalgias.  ?Neurological:  Positive for syncope (ocassional fainting with sudden  movement). Negative for headaches.  ?Psychiatric/Behavioral:  Positive for dysphoric mood. Negative for sleep disturbance. The patient is nervous/anxious.   ? ?Objective:  ?BP 136/84   Pulse 70   Temp (!) 97.5 ?F (36.4 ?C)   Ht 5' 2"  (1.575 m)   Wt 141 lb (64 kg)   SpO2 96%   BMI 25.79 kg/m?  ? ?BP Readings from Last 3 Encounters:  ?05/08/21 136/84  ?04/26/21 140/78  ?03/01/21 128/84  ? ? ?Wt Readings from Last 3 Encounters:  ?05/08/21 141 lb (64 kg)  ?04/26/21 138 lb 12.8 oz (63 kg)  ?03/01/21 137 lb (62.1 kg)  ? ? ? ?Physical  Exam ?Constitutional:   ?   General: She is not in acute distress. ?   Appearance: She is well-developed.  ?HENT:  ?   Head: Normocephalic and atraumatic.  ?Eyes:  ?   Conjunctiva/sclera: Conjunctivae normal.  ?   Pupils: Pupils are equal, round, and reactive to light.  ?Neck:  ?   Thyroid: No thyromegaly.  ?Cardiovascular:  ?   Rate and Rhythm: Normal rate and regular rhythm.  ?   Heart sounds: Normal heart sounds. No murmur heard. ?Pulmonary:  ?   Effort: Pulmonary effort is normal. No respiratory distress.  ?   Breath sounds: Normal breath sounds. No wheezing or rales.  ?Abdominal:  ?   General: Bowel sounds are normal. There is no distension.  ?   Palpations: Abdomen is soft.  ?   Tenderness: There is no abdominal tenderness.  ?Musculoskeletal:     ?   General: Normal range of motion.  ?   Cervical back: Normal range of motion and neck supple.  ?Lymphadenopathy:  ?   Cervical: No cervical adenopathy.  ?Skin: ?   General: Skin is warm and dry.  ?Neurological:  ?   Mental Status: She is alert and oriented to person, place, and time.  ?Psychiatric:     ?   Behavior: Behavior normal.     ?   Thought Content: Thought content normal.     ?   Judgment: Judgment normal.  ? ? ? ? ?Assessment & Plan:  ? ?Ricci was seen today for establish care. ? ?Diagnoses and all orders for this visit: ? ?Hot flashes ?-     FSH/LH ? ?Generalized edema ? ?Arthralgia, unspecified joint ?-     CBC with Differential/Platelet ?-     CMP14+EGFR ?-     Sedimentation rate ?-     Arthritis Panel ? ?Hypothyroidism, unspecified type ?-     TSH + free T4 ? ?Dyspnea on exertion ?-     DG Chest 2 View; Future ? ?Hemorrhoids, unspecified hemorrhoid type ?-     CBC with Differential/Platelet ? ?Vitamin D deficiency ?-     VITAMIN D 25 Hydroxy (Vit-D Deficiency, Fractures) ? ?Depression with anxiety ? ?Depression, recurrent ([place]) ? ?Other orders ?-     hydrocortisone (ANUSOL-HC) 25 MG suppository; Place 1 suppository (25 mg total) rectally 4 (four)  times daily as needed for hemorrhoids. ?-     Psyllium 30.9 % POWD; Drink 1 tablespoon dissolved in water, twice daily ?-     DULoxetine (CYMBALTA) 30 MG capsule; Take 1 capsule (30 mg total) by mouth daily. For one week then two daily. Take with a full stomach at suppertime ? ? ? ? ? ? ?I am having Sandra Powell start on hydrocortisone, Psyllium, and DULoxetine. I am also having her maintain her MULTIPLE VITAMIN PO, atenolol, and  NP Thyroid. We will continue to administer sodium chloride. ? ?Allergies as of 05/08/2021   ?No Known Allergies ?  ? ?  ?Medication List  ?  ? ?  ? Accurate as of May 08, 2021  3:02 PM. If you have any questions, ask your nurse or doctor.  ?  ?  ? ?  ? ?atenolol 50 MG tablet ?Commonly known as: TENORMIN ?Take 1 tablet by mouth once daily ?  ?DULoxetine 30 MG capsule ?Commonly known as: Cymbalta ?Take 1 capsule (30 mg total) by mouth daily. For one week then two daily. Take with a full stomach at suppertime ?Started by: Claretta Fraise, MD ?  ?hydrocortisone 25 MG suppository ?Commonly known as: ANUSOL-HC ?Place 1 suppository (25 mg total) rectally 4 (four) times daily as needed for hemorrhoids. ?Started by: Claretta Fraise, MD ?  ?MULTIPLE VITAMIN PO ?Take 1 tablet by mouth daily. ?  ?NP Thyroid 30 MG tablet ?Generic drug: thyroid ?Take 30 mg by mouth daily. ?What changed: Another medication with the same name was removed. Continue taking this medication, and follow the directions you see here. ?Changed by: Claretta Fraise, MD ?  ?Psyllium 30.9 % Powd ?Drink 1 tablespoon dissolved in water, twice daily ?Started by: Claretta Fraise, MD ?  ? ?  ? ? ? ?Follow-up: Return in about 1 month (around 06/08/2021). ? ?Claretta Fraise, M.D. ?

## 2021-05-09 ENCOUNTER — Other Ambulatory Visit: Payer: Self-pay | Admitting: Emergency Medicine

## 2021-05-09 DIAGNOSIS — R0609 Other forms of dyspnea: Secondary | ICD-10-CM

## 2021-05-09 DIAGNOSIS — E039 Hypothyroidism, unspecified: Secondary | ICD-10-CM

## 2021-05-09 LAB — ARTHRITIS PANEL
Basophils Absolute: 0.1 10*3/uL (ref 0.0–0.2)
Basos: 1 %
EOS (ABSOLUTE): 0.2 10*3/uL (ref 0.0–0.4)
Eos: 3 %
Hematocrit: 42.8 % (ref 34.0–46.6)
Hemoglobin: 14.1 g/dL (ref 11.1–15.9)
Immature Grans (Abs): 0 10*3/uL (ref 0.0–0.1)
Immature Granulocytes: 0 %
Lymphocytes Absolute: 3.4 10*3/uL — ABNORMAL HIGH (ref 0.7–3.1)
Lymphs: 37 %
MCH: 30 pg (ref 26.6–33.0)
MCHC: 32.9 g/dL (ref 31.5–35.7)
MCV: 91 fL (ref 79–97)
Monocytes Absolute: 0.9 10*3/uL (ref 0.1–0.9)
Monocytes: 10 %
Neutrophils Absolute: 4.5 10*3/uL (ref 1.4–7.0)
Neutrophils: 49 %
Platelets: 246 10*3/uL (ref 150–450)
RBC: 4.7 x10E6/uL (ref 3.77–5.28)
RDW: 12.4 % (ref 11.7–15.4)
Rheumatoid fact SerPl-aCnc: <10 IU/mL (ref <14.0)
Sed Rate: 11 mm/hr (ref 0–40)
Uric Acid: 4.7 mg/dL (ref 3.0–7.2)
WBC: 9 10*3/uL (ref 3.4–10.8)

## 2021-05-09 LAB — CMP14+EGFR
ALT: 21 [IU]/L (ref 0–32)
AST: 18 [IU]/L (ref 0–40)
Albumin/Globulin Ratio: 2 (ref 1.2–2.2)
Albumin: 4.7 g/dL (ref 3.8–4.9)
Alkaline Phosphatase: 77 [IU]/L (ref 44–121)
BUN/Creatinine Ratio: 18 (ref 9–23)
BUN: 12 mg/dL (ref 6–24)
Bilirubin Total: 0.2 mg/dL (ref 0.0–1.2)
CO2: 27 mmol/L (ref 20–29)
Calcium: 9.8 mg/dL (ref 8.7–10.2)
Chloride: 100 mmol/L (ref 96–106)
Creatinine, Ser: 0.68 mg/dL (ref 0.57–1.00)
Globulin, Total: 2.3 g/dL (ref 1.5–4.5)
Glucose: 95 mg/dL (ref 70–99)
Potassium: 3.9 mmol/L (ref 3.5–5.2)
Sodium: 140 mmol/L (ref 134–144)
Total Protein: 7 g/dL (ref 6.0–8.5)
eGFR: 105 mL/min/{1.73_m2}

## 2021-05-09 LAB — TSH+FREE T4
Free T4: 0.88 ng/dL (ref 0.82–1.77)
TSH: 4.66 u[IU]/mL — ABNORMAL HIGH (ref 0.450–4.500)

## 2021-05-09 LAB — VITAMIN D 25 HYDROXY (VIT D DEFICIENCY, FRACTURES): Vit D, 25-Hydroxy: 38.6 ng/mL (ref 30.0–100.0)

## 2021-05-09 LAB — FSH/LH
FSH: 72.2 m[IU]/mL
LH: 49.3 m[IU]/mL

## 2021-05-09 NOTE — Progress Notes (Signed)
Your chest x-ray looked normal. Thanks, WS.

## 2021-05-16 ENCOUNTER — Other Ambulatory Visit: Payer: BC Managed Care – PPO

## 2021-05-16 DIAGNOSIS — E039 Hypothyroidism, unspecified: Secondary | ICD-10-CM | POA: Diagnosis not present

## 2021-05-17 LAB — TSH: TSH: 3.95 u[IU]/mL (ref 0.450–4.500)

## 2021-05-17 LAB — T4, FREE: Free T4: 0.91 ng/dL (ref 0.82–1.77)

## 2021-05-31 ENCOUNTER — Ambulatory Visit: Payer: BC Managed Care – PPO | Admitting: Family Medicine

## 2021-06-16 ENCOUNTER — Telehealth: Payer: Self-pay | Admitting: Family Medicine

## 2021-06-16 MED ORDER — DULOXETINE HCL 30 MG PO CPEP: 30.0000 mg | ORAL_CAPSULE | Freq: Every day | ORAL | 0 refills | Status: DC

## 2021-06-16 NOTE — Telephone Encounter (Signed)
Pt is asking if she should have a refill on DULoxetine (CYMBALTA) 30 MG capsule. She has an up coming on 06/28/2021 but she will be out and not in her system.  Pt is aware [facility] is off today.  Please call back Pt uses [facility]

## 2021-06-28 ENCOUNTER — Ambulatory Visit (INDEPENDENT_AMBULATORY_CARE_PROVIDER_SITE_OTHER): Payer: BC Managed Care – PPO | Admitting: Family Medicine

## 2021-06-28 ENCOUNTER — Encounter: Payer: Self-pay | Admitting: Family Medicine

## 2021-06-28 VITALS — BP 123/69 | HR 66 | Temp 97.2°F | Ht 62.0 in | Wt 140.2 lb

## 2021-06-28 DIAGNOSIS — N951 Menopausal and female climacteric states: Secondary | ICD-10-CM | POA: Diagnosis not present

## 2021-06-28 MED ORDER — NP THYROID 30 MG PO TABS: 30.0000 mg | ORAL_TABLET | Freq: Every day | ORAL | 11 refills | Status: DC

## 2021-06-28 NOTE — Progress Notes (Signed)
Subjective:  Patient ID: Sandra Powell, female    DOB: [birth year]  Age: 51 y.o. MRN: [id number]  CC: Follow-up   HPI Sandra Powell presents for doing much better. Using Remifemin. No hot flashes. Helping with sleep.     06/28/2021    4:10 PM 06/28/2021    4:06 PM 05/08/2021    1:11 PM  Depression screen PHQ 2/9  Decreased Interest 0 0 2  Down, Depressed, Hopeless 0 0 2  PHQ - 2 Score 0 0 4  Altered sleeping 0  2  Tired, decreased energy 1  3  Change in appetite 1  3  Feeling bad or failure about yourself  0  2  Trouble concentrating 0  2  Moving slowly or fidgety/restless 0  1  Suicidal thoughts 0  2  PHQ-9 Score 2  19  Difficult doing work/chores Not difficult at all  Somewhat difficult    History Sandra Powell has a past medical history of Allergy, Anemia, Anxiety, Arthritis, Depression, Diverticulitis, Diverticulosis, GERD (gastroesophageal reflux disease), Hypertension, MVP (mitral valve prolapse), Syncope, Syncope and collapse (01/26/2020), and Thyroid disease.   She has a past surgical history that includes Tonsillectomy; Induced abortion (2000); and Endometrial ablation.   Her family history includes Heart disease in her sister; Heart failure (age of onset: 42) in an other family member; Heart failure (age of onset: 54) in her sister; Mitral valve prolapse in her sister; Syncope episode in her father and sister; Uterine cancer in her mother.She reports that she has never smoked. She has been exposed to tobacco smoke. She has never used smokeless tobacco. She reports that she does not currently use alcohol. She reports current drug use. Frequency: 7.00 times per week. Drug: Marijuana.    ROS Review of Systems  Constitutional: Negative.   HENT: Negative.    Eyes:  Negative for visual disturbance.  Respiratory:  Negative for shortness of breath.   Cardiovascular:  Negative for chest pain.  Gastrointestinal:  Negative for abdominal pain.  Musculoskeletal:  Negative for  arthralgias.    Objective:  BP 123/69   Pulse 66   Temp (!) 97.2 F (36.2 C)   Ht '5\' 2"'$  (1.575 m)   Wt 140 lb 3.2 oz (63.6 kg)   SpO2 97%   BMI 25.64 kg/m   BP Readings from Last 3 Encounters:  06/28/21 123/69  05/08/21 136/84  04/26/21 140/78    Wt Readings from Last 3 Encounters:  06/28/21 140 lb 3.2 oz (63.6 kg)  05/08/21 141 lb (64 kg)  04/26/21 138 lb 12.8 oz (63 kg)     Physical Exam Constitutional:      General: She is not in acute distress.    Appearance: She is well-developed.  Cardiovascular:     Rate and Rhythm: Normal rate and regular rhythm.  Pulmonary:     Breath sounds: Normal breath sounds.  Musculoskeletal:        General: Normal range of motion.  Skin:    General: Skin is warm and dry.  Neurological:     Mental Status: She is alert and oriented to person, place, and time.       Assessment & Plan:   Sandra Powell was seen today for follow-up.  Diagnoses and all orders for this visit:  Perimenopause  Other orders -     NP THYROID 30 MG tablet; Take 1 tablet (30 mg total) by mouth daily.       I have changed Sandra Moody NP Thyroid. I am  also having her maintain her MULTIPLE VITAMIN PO, atenolol, hydrocortisone, Psyllium, and DULoxetine. We will continue to administer sodium chloride.  Allergies as of 06/28/2021   No Known Allergies      Medication List        Accurate as of June 28, 2021 11:59 PM. If you have any questions, ask your nurse or doctor.          atenolol 50 MG tablet Commonly known as: TENORMIN Take 1 tablet by mouth once daily   DULoxetine 30 MG capsule Commonly known as: Cymbalta Take 1 capsule (30 mg total) by mouth daily. For one week then two daily. Take with a full stomach at suppertime   hydrocortisone 25 MG suppository Commonly known as: ANUSOL-HC Place 1 suppository (25 mg total) rectally 4 (four) times daily as needed for hemorrhoids.   MULTIPLE VITAMIN PO Take 1 tablet by mouth daily.   NP  Thyroid 30 MG tablet Generic drug: thyroid Take 1 tablet (30 mg total) by mouth daily.   Psyllium 30.9 % Powd Drink 1 tablespoon dissolved in water, twice daily         Follow-up: Return in about 6 months (around 12/28/2021).  Claretta Fraise, M.D.

## 2021-07-04 ENCOUNTER — Encounter: Payer: Self-pay | Admitting: Family Medicine

## 2021-07-06 ENCOUNTER — Other Ambulatory Visit: Payer: Self-pay | Admitting: Family Medicine

## 2021-07-10 DIAGNOSIS — Z1231 Encounter for screening mammogram for malignant neoplasm of breast: Secondary | ICD-10-CM | POA: Diagnosis not present

## 2021-08-06 ENCOUNTER — Other Ambulatory Visit: Payer: Self-pay | Admitting: Family Medicine

## 2022-02-13 ENCOUNTER — Other Ambulatory Visit: Payer: Self-pay | Admitting: Cardiology

## 2022-02-13 ENCOUNTER — Other Ambulatory Visit: Payer: Self-pay | Admitting: Family Medicine

## 2022-03-17 ENCOUNTER — Other Ambulatory Visit: Payer: Self-pay | Admitting: Family Medicine

## 2022-03-19 ENCOUNTER — Other Ambulatory Visit: Payer: Self-pay | Admitting: Cardiology

## 2022-03-20 NOTE — Telephone Encounter (Signed)
Stacks NTBS 30 days given 02/13/22

## 2022-03-20 NOTE — Telephone Encounter (Signed)
Pt schedule appt on 03/28/22 with Dr. Livia Snellen

## 2022-03-21 ENCOUNTER — Other Ambulatory Visit: Payer: Self-pay | Admitting: Family Medicine

## 2022-03-21 ENCOUNTER — Inpatient Hospital Stay: Admission: RE | Admit: 2022-03-21 | Payer: Medicaid Other | Source: Ambulatory Visit

## 2022-03-21 DIAGNOSIS — Z1231 Encounter for screening mammogram for malignant neoplasm of breast: Secondary | ICD-10-CM

## 2022-03-22 NOTE — Progress Notes (Deleted)
Cardiology Office Note   Date:  03/22/2022   ID:  Sandra Powell, DOB [birth year], MRN [id number]  PCP:  Claretta Fraise, MD  Cardiologist:   None   No chief complaint on file.     History of Present Illness: Sandra Powell is a 52 y.o. female who presents for evaluation of MVP.  She has a previous history of syncope.  She an extensive work up in [place].   Since I last saw her ***   ***  she has done well.  She does occasionally get some fleeting chest discomfort or maybe some chest discomfort when she is emotionally stressed.  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  When she is physically active she feels fine.   She does not have any shortness of breath, PND or orthopnea.  She does not notice any palpitations, presyncope or syncope.  She has had no weight gain or edema.   Past Medical History:  Diagnosis Date   Allergy    Anemia    Anxiety    Arthritis    Depression    Diverticulitis    Diverticulosis    GERD (gastroesophageal reflux disease)    Hypertension    MVP (mitral valve prolapse)    Syncope    Syncope and collapse 01/26/2020   Thyroid disease     Past Surgical History:  Procedure Laterality Date   ENDOMETRIAL ABLATION     INDUCED ABORTION  2000   TONSILLECTOMY       Current Outpatient Medications  Medication Sig Dispense Refill   atenolol (TENORMIN) 50 MG tablet TAKE 1 TABLET BY MOUTH ONCE DAILY SCHEDULE  AN  APPOINTMENT  FOR  FUTURE  REFILLS 30 tablet 0   DULoxetine (CYMBALTA) 30 MG capsule Take 2 capsules (60 mg total) by mouth daily. With a full stomach at suppertime (NEEDS TO BE SEEN BEFORE NEXT REFILL) 60 capsule 0   hydrocortisone (ANUSOL-HC) 25 MG suppository Place 1 suppository (25 mg total) rectally 4 (four) times daily as needed for hemorrhoids. 12 suppository 5   MULTIPLE VITAMIN PO Take 1 tablet by mouth daily.     NP THYROID 30 MG tablet Take 1 tablet (30 mg total) by mouth daily. 30 tablet 11   Psyllium 30.9 %  POWD Drink 1 tablespoon dissolved in water, twice daily 454 g 11   Current Facility-Administered Medications  Medication Dose Route Frequency Provider Last Rate Last Admin   0.9 %  sodium chloride infusion  500 mL Intravenous Once Irene Shipper, MD        Allergies:   Patient has no known allergies.    ROS:  Please see the history of present illness.   Otherwise, review of systems are positive for ***.   All other systems are reviewed and negative.    PHYSICAL EXAM: VS:  There were no vitals taken for this visit. , BMI There is no height or weight on file to calculate BMI. GENERAL:  Well appearing NECK:  No jugular venous distention, waveform within normal limits, carotid upstroke brisk and symmetric, no bruits, no thyromegaly LUNGS:  Clear to auscultation bilaterally CHEST:  Unremarkable HEART:  PMI not displaced or sustained,S1 and S2 within normal limits, no S3, no S4, no clicks, no rubs, *** murmurs ABD:  Flat, positive bowel sounds normal in frequency in pitch, no bruits, no rebound, no guarding, no midline pulsatile mass, no hepatomegaly, no splenomegaly EXT:  2 plus pulses  throughout, no edema, no cyanosis no clubbing    ***GENERAL:  Well appearing NECK:  No jugular venous distention, waveform within normal limits, carotid upstroke brisk and symmetric, no bruits, no thyromegaly LUNGS:  Clear to auscultation bilaterally CHEST:  Unremarkable HEART:  PMI not displaced or sustained,S1 and S2 within normal limits, no S3, no S4, no clicks, no rubs, brief soft apical murmur heard only with the patient in the left lateral recumbent position, no diastolic murmurs ABD:  Flat, positive bowel sounds normal in frequency in pitch, no bruits, no rebound, no guarding, no midline pulsatile mass, no hepatomegaly, no splenomegaly EXT:  2 plus pulses throughout, no edema, no cyanosis no clubbing  EKG:  EKG is *** ordered today. The ekg ordered today demonstrates sinus rhythm, rate ***, axis  within normal limits, intervals within normal limits, no acute ST-T wave changes.   Recent Labs: 05/08/2021: ALT 21; BUN 12; Creatinine, Ser 0.68; Hemoglobin 14.1; Platelets 246; Potassium 3.9; Sodium 140 05/16/2021: TSH 3.950    Lipid Panel    Component Value Date/Time   CHOL 189 04/09/2017 1248   TRIG 94 04/09/2017 1248   TRIG 128 11/17/2012 1258   HDL 44 04/09/2017 1248   HDL 40 11/17/2012 1258   CHOLHDL 4.3 04/09/2017 1248   LDLCALC 126 (H) 04/09/2017 1248   LDLCALC 111 (H) 11/17/2012 1258      Wt Readings from Last 3 Encounters:  06/28/21 140 lb 3.2 oz (63.6 kg)  05/08/21 141 lb (64 kg)  04/26/21 138 lb 12.8 oz (63 kg)      Other studies Reviewed: Additional studies/ records that were reviewed today include: ***. Review of the above records demonstrates: ***   ASSESSMENT AND PLAN:    MVP:    ***  She has no change in her exam.  She did have some mild MR in 2019 and I will repeat an echo next year as it would have been 5 years.    Current medicines are reviewed at length with the patient today.  The patient does not have concerns regarding medicines.  The following changes have been made: ***  Labs/ tests ordered today include: ***  No orders of the defined types were placed in this encounter.    Disposition:   FU with me in ***   Signed, Minus Breeding, MD  03/22/2022 5:36 PM    [place]

## 2022-03-23 ENCOUNTER — Ambulatory Visit: Payer: Medicaid Other | Admitting: Cardiology

## 2022-03-23 DIAGNOSIS — I341 Nonrheumatic mitral (valve) prolapse: Secondary | ICD-10-CM

## 2022-03-28 ENCOUNTER — Encounter: Payer: Self-pay | Admitting: Family Medicine

## 2022-03-28 ENCOUNTER — Ambulatory Visit: Payer: Medicaid Other | Admitting: Family Medicine

## 2022-03-28 VITALS — BP 118/66 | HR 65 | Temp 97.7°F | Ht 62.0 in | Wt 145.2 lb

## 2022-03-28 DIAGNOSIS — K649 Unspecified hemorrhoids: Secondary | ICD-10-CM

## 2022-03-28 DIAGNOSIS — R232 Flushing: Secondary | ICD-10-CM

## 2022-03-28 DIAGNOSIS — F339 Major depressive disorder, recurrent, unspecified: Secondary | ICD-10-CM

## 2022-03-28 DIAGNOSIS — E039 Hypothyroidism, unspecified: Secondary | ICD-10-CM

## 2022-03-28 MED ORDER — NP THYROID 30 MG PO TABS: 30.0000 mg | ORAL_TABLET | Freq: Every day | ORAL | 11 refills | Status: DC

## 2022-03-28 MED ORDER — DULOXETINE HCL 30 MG PO CPEP: 60.0000 mg | ORAL_CAPSULE | Freq: Every day | ORAL | 3 refills | Status: DC

## 2022-03-28 NOTE — Progress Notes (Signed)
Subjective:  Patient ID: Sandra Powell, female    DOB: 1970-12-28  Age: 52 y.o. MRN: [id number]  CC: Medical Management of Chronic Issues   HPI Sandra Powell presents for  follow-up on  thyroid. The patient has a history of hypothyroidism for many years. It has been stable recently. Pt. denies any change in  voice, loss of hair, heat or cold intolerance. Energy level has been adequate to good. Patient denies constipation and diarrhea. No myxedema. Medication is as noted below. Verified that pt is taking it daily on an empty stomach. Well tolerated.  Still having hot flashes. Had not been able to get remifimin for a while and has recently restarted.   Feels depression is doing very well, much better with use of Cymbalta. No apparent adverse reaction noted by her.   Has had painless rectal bleeding from hemorrhoids for several weeks. Denies constipation. No abd. Pain. No longer using the metamucil noted on med list.      03/28/2022    3:25 PM 06/28/2021    4:10 PM 06/28/2021    4:06 PM  Depression screen PHQ 2/9  Decreased Interest 0 0 0  Down, Depressed, Hopeless 0 0 0  PHQ - 2 Score 0 0 0  Altered sleeping  0   Tired, decreased energy  1   Change in appetite  1   Feeling bad or failure about yourself   0   Trouble concentrating  0   Moving slowly or fidgety/restless  0   Suicidal thoughts  0   PHQ-9 Score  2   Difficult doing work/chores  Not difficult at all     History Sandra Powell has a past medical history of Allergy, Anemia, Anxiety, Arthritis, Depression, Diverticulitis, Diverticulosis, GERD (gastroesophageal reflux disease), Hypertension, MVP (mitral valve prolapse), Syncope, Syncope and collapse (01/26/2020), and Thyroid disease.   She has a past surgical history that includes Tonsillectomy; Induced abortion (2000); and Endometrial ablation.   Her family history includes Heart disease in her sister; Heart failure (age of onset: 68) in an other family member; Heart failure (age of  onset: 58) in her sister; Mitral valve prolapse in her sister; Syncope episode in her father and sister; Uterine cancer in her mother.She reports that she has never smoked. She has been exposed to tobacco smoke. She has never used smokeless tobacco. She reports that she does not currently use alcohol. She reports current drug use. Frequency: 7.00 times per week. Drug: Marijuana.    ROS Review of Systems  Constitutional:  Positive for fatigue.  HENT: Negative.    Eyes:  Negative for visual disturbance.  Respiratory:  Negative for shortness of breath.   Cardiovascular:  Negative for chest pain.  Gastrointestinal:  Positive for blood in stool. Negative for abdominal pain.  Genitourinary:  Positive for menstrual problem (hot flashes).  Musculoskeletal:  Negative for arthralgias.    Objective:  BP 118/66   Pulse 65   Temp 97.7 F (36.5 C)   Ht 5\' 2"  (1.575 m)   Wt 145 lb 3.2 oz (65.9 kg)   SpO2 96%   BMI 26.56 kg/m   BP Readings from Last 3 Encounters:  03/28/22 118/66  06/28/21 123/69  05/08/21 136/84    Wt Readings from Last 3 Encounters:  03/28/22 145 lb 3.2 oz (65.9 kg)  06/28/21 140 lb 3.2 oz (63.6 kg)  05/08/21 141 lb (64 kg)     Physical Exam Constitutional:      General: She is not in acute  distress.    Appearance: She is well-developed.  HENT:     Head: Normocephalic and atraumatic.  Eyes:     Conjunctiva/sclera: Conjunctivae normal.     Pupils: Pupils are equal, round, and reactive to light.  Neck:     Thyroid: No thyromegaly.  Cardiovascular:     Rate and Rhythm: Normal rate and regular rhythm.     Heart sounds: Normal heart sounds. No murmur heard. Pulmonary:     Effort: Pulmonary effort is normal. No respiratory distress.     Breath sounds: Normal breath sounds. No wheezing or rales.  Abdominal:     General: Bowel sounds are normal. There is no distension.     Palpations: Abdomen is soft.     Tenderness: There is no abdominal tenderness.   Musculoskeletal:        General: Normal range of motion.     Cervical back: Normal range of motion and neck supple.  Lymphadenopathy:     Cervical: No cervical adenopathy.  Skin:    General: Skin is warm and dry.  Neurological:     Mental Status: She is alert and oriented to person, place, and time.  Psychiatric:        Behavior: Behavior normal.        Thought Content: Thought content normal.        Judgment: Judgment normal.       Assessment & Plan:   Sandra Powell was seen today for medical management of chronic issues.  Diagnoses and all orders for this visit:  Hypothyroidism, unspecified type -     CBC with Differential/Platelet -     CMP14+EGFR -     TSH + free T4  Hot flashes -     CBC with Differential/Platelet -     CMP14+EGFR -     TSH + free T4  Depression, recurrent ([facility]) -     CBC with Differential/Platelet -     CMP14+EGFR -     TSH + free T4  Hemorrhoids, unspecified hemorrhoid type -     CBC with Differential/Platelet -     CMP14+EGFR  Other orders -     DULoxetine (CYMBALTA) 30 MG capsule; Take 2 capsules (60 mg total) by mouth daily. With a full stomach at suppertime -     NP THYROID 30 MG tablet; Take 1 tablet (30 mg total) by mouth daily.       I have discontinued Trace Hankes MULTIPLE VITAMIN PO. I have also changed her DULoxetine. Additionally, I am having her maintain her hydrocortisone, Psyllium, atenolol, and NP Thyroid. We will continue to administer sodium chloride.  Allergies as of 03/28/2022   No Known Allergies      Medication List        Accurate as of March 28, 2022  8:29 PM. If you have any questions, ask your nurse or doctor.          STOP taking these medications    MULTIPLE VITAMIN PO Stopped by: Claretta Fraise, MD       TAKE these medications    atenolol 50 MG tablet Commonly known as: TENORMIN TAKE 1 TABLET BY MOUTH ONCE DAILY SCHEDULE  AN  APPOINTMENT  FOR  FUTURE  REFILLS   DULoxetine 30 MG  capsule Commonly known as: CYMBALTA Take 2 capsules (60 mg total) by mouth daily. With a full stomach at suppertime What changed: additional instructions Changed by: Claretta Fraise, MD   hydrocortisone 25 MG suppository Commonly known as: ANUSOL-HC  Place 1 suppository (25 mg total) rectally 4 (four) times daily as needed for hemorrhoids.   NP Thyroid 30 MG tablet Generic drug: thyroid Take 1 tablet (30 mg total) by mouth daily.   Psyllium 30.9 % Powd Drink 1 tablespoon dissolved in water, twice daily         Follow-up: Return in about 6 weeks (around 05/09/2022) for Complete physical & GYN.  Claretta Fraise, M.D.

## 2022-03-30 LAB — CBC WITH DIFFERENTIAL/PLATELET
Basophils Absolute: 0.1 10*3/uL (ref 0.0–0.2)
Basos: 1 %
EOS (ABSOLUTE): 0.2 10*3/uL (ref 0.0–0.4)
Eos: 3 %
Hematocrit: 41.9 % (ref 34.0–46.6)
Hemoglobin: 14.2 g/dL (ref 11.1–15.9)
Immature Grans (Abs): 0 10*3/uL (ref 0.0–0.1)
Immature Granulocytes: 0 %
Lymphocytes Absolute: 3.1 10*3/uL (ref 0.7–3.1)
Lymphs: 39 %
MCH: 30.6 pg (ref 26.6–33.0)
MCHC: 33.9 g/dL (ref 31.5–35.7)
MCV: 90 fL (ref 79–97)
Monocytes Absolute: 0.9 10*3/uL (ref 0.1–0.9)
Monocytes: 11 %
Neutrophils Absolute: 3.8 10*3/uL (ref 1.4–7.0)
Neutrophils: 46 %
Platelets: 249 10*3/uL (ref 150–450)
RBC: 4.64 x10E6/uL (ref 3.77–5.28)
RDW: 12.3 % (ref 11.7–15.4)
WBC: 8.1 10*3/uL (ref 3.4–10.8)

## 2022-03-30 LAB — CMP14+EGFR
ALT: 13 IU/L (ref 0–32)
AST: 17 IU/L (ref 0–40)
Albumin/Globulin Ratio: 1.9 (ref 1.2–2.2)
Albumin: 4.4 g/dL (ref 3.8–4.9)
Alkaline Phosphatase: 75 IU/L (ref 44–121)
BUN/Creatinine Ratio: 13 (ref 9–23)
BUN: 11 mg/dL (ref 6–24)
Bilirubin Total: 0.3 mg/dL (ref 0.0–1.2)
CO2: 25 mmol/L (ref 20–29)
Calcium: 9.7 mg/dL (ref 8.7–10.2)
Chloride: 103 mmol/L (ref 96–106)
Creatinine, Ser: 0.86 mg/dL (ref 0.57–1.00)
Globulin, Total: 2.3 g/dL (ref 1.5–4.5)
Glucose: 75 mg/dL (ref 70–99)
Potassium: 4.3 mmol/L (ref 3.5–5.2)
Sodium: 143 mmol/L (ref 134–144)
Total Protein: 6.7 g/dL (ref 6.0–8.5)
eGFR: 81 mL/min/{1.73_m2} (ref >59)

## 2022-03-30 LAB — TSH+FREE T4
Free T4: 0.81 ng/dL — ABNORMAL LOW (ref 0.82–1.77)
TSH: 5.27 u[IU]/mL — ABNORMAL HIGH (ref 0.450–4.500)

## 2022-04-03 ENCOUNTER — Other Ambulatory Visit: Payer: Self-pay | Admitting: Family Medicine

## 2022-04-03 MED ORDER — THYROID 60 MG PO TABS: 60.0000 mg | ORAL_TABLET | Freq: Every day | ORAL | 1 refills | Status: DC

## 2022-04-04 ENCOUNTER — Other Ambulatory Visit: Payer: Self-pay | Admitting: *Deleted

## 2022-04-04 DIAGNOSIS — E039 Hypothyroidism, unspecified: Secondary | ICD-10-CM

## 2022-04-05 ENCOUNTER — Other Ambulatory Visit: Payer: Self-pay | Admitting: *Deleted

## 2022-04-05 DIAGNOSIS — E039 Hypothyroidism, unspecified: Secondary | ICD-10-CM

## 2022-04-14 ENCOUNTER — Other Ambulatory Visit: Payer: Self-pay | Admitting: Cardiology

## 2022-05-03 ENCOUNTER — Other Ambulatory Visit: Payer: Medicaid Other

## 2022-05-03 DIAGNOSIS — E039 Hypothyroidism, unspecified: Secondary | ICD-10-CM | POA: Diagnosis not present

## 2022-05-04 LAB — TSH+FREE T4
Free T4: 0.85 ng/dL (ref 0.82–1.77)
TSH: 1.68 u[IU]/mL (ref 0.450–4.500)

## 2022-05-09 ENCOUNTER — Encounter: Payer: Self-pay | Admitting: Family Medicine

## 2022-05-09 ENCOUNTER — Ambulatory Visit: Payer: Medicaid Other | Admitting: Family Medicine

## 2022-05-09 VITALS — BP 107/72 | HR 78 | Temp 97.0°F | Ht 62.0 in | Wt 143.2 lb

## 2022-05-09 DIAGNOSIS — N951 Menopausal and female climacteric states: Secondary | ICD-10-CM

## 2022-05-09 DIAGNOSIS — R232 Flushing: Secondary | ICD-10-CM | POA: Diagnosis not present

## 2022-05-09 DIAGNOSIS — E039 Hypothyroidism, unspecified: Secondary | ICD-10-CM | POA: Diagnosis not present

## 2022-05-09 MED ORDER — VEOZAH 45 MG PO TABS: 45.0000 mg | ORAL_TABLET | Freq: Every day | ORAL | 0 refills | Status: AC

## 2022-05-09 NOTE — Progress Notes (Signed)
Subjective:  Patient ID: Sandra Powell, female    DOB: [birth year]  Age: 52 y.o. MRN: [id number]  CC: Hypothyroidism   HPI Keylyn Colestock presents for continued hot flashes. Remifemin no longer helping. Having 4-5 episodes a day. Only on the new dose of thyroid med for 3 weeks. Had level  drawn last week.      05/09/2022    3:48 PM 03/28/2022    3:25 PM 06/28/2021    4:10 PM  Depression screen PHQ 2/9  Decreased Interest 0 0 0  Down, Depressed, Hopeless 0 0 0  PHQ - 2 Score 0 0 0  Altered sleeping   0  Tired, decreased energy   1  Change in appetite   1  Feeling bad or failure about yourself    0  Trouble concentrating   0  Moving slowly or fidgety/restless   0  Suicidal thoughts   0  PHQ-9 Score   2  Difficult doing work/chores   Not difficult at all    History Lounell has a past medical history of Allergy, Anemia, Anxiety, Arthritis, Depression, Diverticulitis, Diverticulosis, GERD (gastroesophageal reflux disease), Hypertension, MVP (mitral valve prolapse), Syncope, Syncope and collapse (01/26/2020), and Thyroid disease.   She has a past surgical history that includes Tonsillectomy; Induced abortion (2000); and Endometrial ablation.   Her family history includes Heart disease in her sister; Heart failure (age of onset: 35) in an other family member; Heart failure (age of onset: 67) in her sister; Mitral valve prolapse in her sister; Syncope episode in her father and sister; Uterine cancer in her mother.She reports that she has never smoked. She has been exposed to tobacco smoke. She has never used smokeless tobacco. She reports that she does not currently use alcohol. She reports current drug use. Frequency: 7.00 times per week. Drug: Marijuana.    ROS Review of Systems  Constitutional: Negative.   HENT: Negative.    Eyes:  Negative for visual disturbance.  Respiratory:  Negative for shortness of breath.   Cardiovascular:  Negative for chest pain.  Gastrointestinal:  Negative  for abdominal pain.  Musculoskeletal:  Negative for arthralgias.    Objective:  BP 107/72   Pulse 78   Temp (!) 97 F (36.1 C)   Ht 5\' 2"  (1.575 m)   Wt 143 lb 3.2 oz (65 kg)   SpO2 96%   BMI 26.19 kg/m   BP Readings from Last 3 Encounters:  05/09/22 107/72  03/28/22 118/66  06/28/21 123/69    Wt Readings from Last 3 Encounters:  05/09/22 143 lb 3.2 oz (65 kg)  03/28/22 145 lb 3.2 oz (65.9 kg)  06/28/21 140 lb 3.2 oz (63.6 kg)     Physical Exam Constitutional:      General: She is not in acute distress.    Appearance: She is well-developed.  Cardiovascular:     Rate and Rhythm: Normal rate and regular rhythm.  Pulmonary:     Breath sounds: Normal breath sounds.  Musculoskeletal:        General: Normal range of motion.  Skin:    General: Skin is warm and dry.  Neurological:     Mental Status: She is alert and oriented to person, place, and time.       Assessment & Plan:   Icelyn was seen today for hypothyroidism.  Diagnoses and all orders for this visit:  Hot flashes -     Fezolinetant (VEOZAH) 45 MG TABS; Take 1 tablet (45 mg total)  by mouth daily for 10 days.  Perimenopause -     Fezolinetant (VEOZAH) 45 MG TABS; Take 1 tablet (45 mg total) by mouth daily for 10 days.  Hypothyroidism, unspecified type       I am having Ernest Pine start on Veozah. I am also having her maintain her hydrocortisone, Psyllium, DULoxetine, thyroid, and atenolol. We will continue to administer sodium chloride.  Allergies as of 05/09/2022   No Known Allergies      Medication List        Accurate as of May 09, 2022 11:59 PM. If you have any questions, ask your nurse or doctor.          atenolol 50 MG tablet Commonly known as: TENORMIN TAKE 1 TABLET BY MOUTH ONCE DAILY ** SCHEDULE  APPOINTMENT  FOR  FURTHER  REFILLS   DULoxetine 30 MG capsule Commonly known as: CYMBALTA Take 2 capsules (60 mg total) by mouth daily. With a full stomach at suppertime    hydrocortisone 25 MG suppository Commonly known as: ANUSOL-HC Place 1 suppository (25 mg total) rectally 4 (four) times daily as needed for hemorrhoids.   Psyllium 30.9 % Powd Drink 1 tablespoon dissolved in water, twice daily   thyroid 60 MG tablet Commonly known as: NP Thyroid Take 1 tablet (60 mg total) by mouth daily before breakfast.   Veozah 45 MG Tabs Generic drug: Fezolinetant Take 1 tablet (45 mg total) by mouth daily for 10 days. Started by: Mechele Claude, MD         Follow-up: Return in about 6 weeks (around 06/20/2022).  Mechele Claude, M.D.

## 2022-05-11 ENCOUNTER — Encounter: Payer: Self-pay | Admitting: Family Medicine

## 2022-05-15 ENCOUNTER — Telehealth: Payer: Self-pay

## 2022-05-15 NOTE — Telephone Encounter (Signed)
Fax sent to the plan Your PA has been faxed to the plan as a paper copy. Please contact the plan directly if you haven't received a determination in a typical timeframe.  You will be notified of the determination via fax. How do I know if the plan approved the PA?  Add Reminder to your Dashboard Remind me in:  5 business days Contact plan to follow up on [id number]

## 2022-05-16 ENCOUNTER — Other Ambulatory Visit: Payer: Self-pay | Admitting: Cardiology

## 2022-05-16 NOTE — Telephone Encounter (Signed)
Insurance denied [employer] 

## 2022-05-16 NOTE — Telephone Encounter (Signed)
Please notify pt

## 2022-05-16 NOTE — Telephone Encounter (Signed)
Left message for pt to return call.

## 2022-05-18 NOTE — Telephone Encounter (Signed)
Patient aware.

## 2022-05-20 NOTE — Progress Notes (Unsigned)
  Cardiology Office Note:   Date:  05/23/2022  ID:  Sandra Powell, DOB 1970/07/24, MRN [id number]  History of Present Illness:   Sandra Powell is a 52 y.o. female who presents for evaluation of MVP.  She has a previous history of syncope.  She an extensive work up in [place].   Since I last saw her she has done well from a cardiac standpoint though she is now being treated with Cymbalta for depression.  She is under stress because her rent is going to go away up and she may have to move back to [place].   The patient denies any new symptoms such as chest discomfort, neck or arm discomfort. There has been no new shortness of breath, PND or orthopnea. There have been no reported palpitations, presyncope or syncope.m   ROS: As stated in the HPI and negative for all other systems.  Studies Reviewed:    EKG: Normal sinus rhythm, rate 68, axis within normal limits, intervals within normal limits, no acute ST-T wave changes.   Risk Assessment/Calculations:              Physical Exam:   VS:  BP 120/84   Pulse 68   Ht 5\' 2"  (1.575 m)   Wt 141 lb (64 kg)   BMI 25.79 kg/m    Wt Readings from Last 3 Encounters:  05/23/22 141 lb (64 kg)  05/09/22 143 lb 3.2 oz (65 kg)  03/28/22 145 lb 3.2 oz (65.9 kg)     GEN: Well nourished, well developed in no acute distress NECK: No JVD; No carotid bruits CARDIAC: RRR, no murmurs, rubs, gallops RESPIRATORY:  Clear to auscultation without rales, wheezing or rhonchi  ABDOMEN: Soft, non-tender, non-distended EXTREMITIES:  No edema; No deformity   ASSESSMENT AND PLAN:   SYNCOPE: She has had no further syncope.  No further workup is suggested.   MVP:    She has some mild mitral regurgitation 2019.  I will repeat an echocardiogram as it has been 5 years since the last 1.  S      Signed, Rollene Rotunda, MD

## 2022-05-23 ENCOUNTER — Ambulatory Visit (INDEPENDENT_AMBULATORY_CARE_PROVIDER_SITE_OTHER): Payer: Medicaid Other | Admitting: Cardiology

## 2022-05-23 ENCOUNTER — Encounter: Payer: Self-pay | Admitting: Cardiology

## 2022-05-23 VITALS — BP 120/84 | HR 68 | Ht 62.0 in | Wt 141.0 lb

## 2022-05-23 DIAGNOSIS — R55 Syncope and collapse: Secondary | ICD-10-CM

## 2022-05-23 DIAGNOSIS — I341 Nonrheumatic mitral (valve) prolapse: Secondary | ICD-10-CM | POA: Diagnosis not present

## 2022-05-23 NOTE — Patient Instructions (Signed)
Medication Instructions:  The current medical regimen is effective;  continue present plan and medications.  *If you need a refill on your cardiac medications before your next appointment, please call your pharmacy*  Testing/Procedures: Your physician has requested that you have an echocardiogram. Echocardiography is a painless test that uses sound waves to create images of your heart. It provides your doctor with information about the size and shape of your heart and how well your heart's chambers and valves are working. This procedure takes approximately one hour. There are no restrictions for this procedure. Please do NOT wear cologne, perfume, aftershave, or lotions (deodorant is allowed). Please arrive 15 minutes prior to your appointment time. This will be completed at [facility] and you will be contacted to be scheduled.  Follow-Up: At [facility], you and your health needs are our priority.  As part of our continuing mission to provide you with exceptional heart care, we have created designated Provider Care Teams.  These Care Teams include your primary Cardiologist (physician) and Advanced Practice Providers (APPs -  Physician Assistants and Nurse Practitioners) who all work together to provide you with the care you need, when you need it.  We recommend signing up for the patient portal called "MyChart".  Sign up information is provided on this After Visit Summary.  MyChart is used to connect with patients for Virtual Visits (Telemedicine).  Patients are able to view lab/test results, encounter notes, upcoming appointments, etc.  Non-urgent messages can be sent to your provider as well.   To learn more about what you can do with MyChart, go to [email or web address].    Your next appointment:   1 year(s)  Provider:   Rollene Rotunda, MD

## 2022-06-04 ENCOUNTER — Other Ambulatory Visit: Payer: Self-pay | Admitting: Cardiology

## 2022-07-12 ENCOUNTER — Ambulatory Visit (HOSPITAL_COMMUNITY)
Admission: RE | Admit: 2022-07-12 | Discharge: 2022-07-12 | Disposition: A | Discharge status: Home / Self Care | Payer: Medicaid Other | Source: Ambulatory Visit | Attending: Cardiology | Admitting: Cardiology

## 2022-07-12 DIAGNOSIS — I341 Nonrheumatic mitral (valve) prolapse: Secondary | ICD-10-CM | POA: Diagnosis not present

## 2022-07-12 LAB — ECHOCARDIOGRAM COMPLETE
Area-P 1/2: 3.93 cm2
MV M vel: 2.58 m/s
MV Peak grad: 26.6 mmHg
S' Lateral: 3 cm

## 2022-07-12 NOTE — Progress Notes (Signed)
  Echocardiogram 2D Echocardiogram has been performed.  Maren Reamer 07/12/2022, 2:54 PM

## 2022-07-17 ENCOUNTER — Encounter: Payer: Self-pay | Admitting: *Deleted

## 2022-07-25 ENCOUNTER — Ambulatory Visit
Admission: RE | Admit: 2022-07-25 | Discharge: 2022-07-25 | Disposition: A | Discharge status: Home / Self Care | Payer: Medicaid Other | Source: Ambulatory Visit | Attending: Family Medicine | Admitting: Family Medicine

## 2022-07-25 DIAGNOSIS — Z1231 Encounter for screening mammogram for malignant neoplasm of breast: Secondary | ICD-10-CM | POA: Diagnosis not present

## 2022-10-08 ENCOUNTER — Other Ambulatory Visit: Payer: Self-pay | Admitting: Family Medicine

## 2023-02-05 ENCOUNTER — Telehealth: Payer: Self-pay | Admitting: Cardiology

## 2023-02-05 NOTE — Telephone Encounter (Signed)
 Called and spoke with patient who states I had an echo done at [facility] last July and I still don't know what the results are. Upon review of the chart it was discovered that she did see her [facility] message and had received the letter that was mailed to her at that time.  Letter reviewed. No further questions.

## 2023-02-05 NOTE — Telephone Encounter (Signed)
Patient is requesting a call back to discuss 7/11 echo results.

## 2023-02-15 ENCOUNTER — Other Ambulatory Visit: Payer: Self-pay | Admitting: Family Medicine

## 2023-06-03 NOTE — Progress Notes (Unsigned)
  Cardiology Office Note:   Date:  06/05/2023  ID:  Sandra Powell, DOB 1970/06/27, MRN [id number] PCP: Roise Cleaver, MD  [facility] Providers Cardiologist:  Eilleen Grates, MD {  History of Present Illness:   Sandra Powell is a 53 y.o. female who presents for evaluation of MVP.  She has a previous history of syncope.  Echocardiography in July 2024 demonstrated mild MR with mild mitral valve prolapse.  She has normal left function.  She did have a small pericardial effusion.  Since I last saw her she has done okay.  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 The patient denies any new symptoms such as chest discomfort, neck or arm discomfort. There has been no new shortness of breath, PND or orthopnea. There have been no reported palpitations, presyncope or syncope.    ROS: As stated in the HPI and negative for all other systems.  Studies Reviewed:    EKG:   EKG Interpretation Date/Time:  Wednesday June 05 2023 16:20:07 EDT Ventricular Rate:  70 PR Interval:  166 QRS Duration:  74 QT Interval:  382 QTC Calculation: 412 R Axis:   73  Text Interpretation: Normal sinus rhythm When compared with ECG of 08-Jun-2013 13:24, No significant change was found Confirmed by Eilleen Grates ([id number]) on 06/05/2023 4:44:50 PM    Risk Assessment/Calculations:              Physical Exam:   VS:  BP 132/86   Pulse 70   Ht 5\' 2"  (1.575 m)   Wt 149 lb (67.6 kg)   BMI 27.25 kg/m    Wt Readings from Last 3 Encounters:  06/05/23 149 lb (67.6 kg)  05/23/22 141 lb (64 kg)  05/09/22 143 lb 3.2 oz (65 kg)     GEN: Well nourished, well developed in no acute distress NECK: No JVD; No carotid bruits CARDIAC: RRR, no murmurs, rubs, gallops RESPIRATORY:  Clear to auscultation without rales, wheezing or rhonchi  ABDOMEN: Soft, non-tender, non-distended EXTREMITIES:  No edema; No deformity   ASSESSMENT AND PLAN:   PERICARDIAL EFFUSION: This was mild and I  will check it again in July.  No change in therapy.   MVP:    She has mild mitral valve prolapse and mitral regurgitation.  If this is unchanged in the July I will probably wait a few more years to follow-up.  She has no symptoms.      Follow up with me in a couple of years.  Signed, Eilleen Grates, MD

## 2023-06-05 ENCOUNTER — Encounter: Payer: Self-pay | Admitting: Cardiology

## 2023-06-05 ENCOUNTER — Ambulatory Visit (INDEPENDENT_AMBULATORY_CARE_PROVIDER_SITE_OTHER): Payer: Medicaid Other | Admitting: Cardiology

## 2023-06-05 VITALS — BP 132/86 | HR 70 | Ht 62.0 in | Wt 149.0 lb

## 2023-06-05 DIAGNOSIS — R55 Syncope and collapse: Secondary | ICD-10-CM

## 2023-06-05 DIAGNOSIS — I341 Nonrheumatic mitral (valve) prolapse: Secondary | ICD-10-CM

## 2023-06-05 NOTE — Patient Instructions (Signed)
 Medication Instructions:  Your physician recommends that you continue on your current medications as directed. Please refer to the Current Medication list given to you today.  *If you need a refill on your cardiac medications before your next appointment, please call your pharmacy*  Lab Work: NONE   If you have labs (blood work) drawn today and your tests are completely normal, you will receive your results only by: MyChart Message (if you have MyChart) OR A paper copy in the mail If you have any lab test that is abnormal or we need to change your treatment, we will call you to review the results.  Testing/Procedures: Your physician has requested that you have an echocardiogram. Echocardiography is a painless test that uses sound waves to create images of your heart. It provides your doctor with information about the size and shape of your heart and how well your heart's chambers and valves are working. This procedure takes approximately one hour. There are no restrictions for this procedure. Please do NOT wear cologne, perfume, aftershave, or lotions (deodorant is allowed). Please arrive 15 minutes prior to your appointment time.  Please note: We ask at that you not bring children with you during ultrasound (echo/ vascular) testing. Due to room size and safety concerns, children are not allowed in the ultrasound rooms during exams. Our front office staff cannot provide observation of children in our lobby area while testing is being conducted. An adult accompanying a patient to their appointment will only be allowed in the ultrasound room at the discretion of the ultrasound technician under special circumstances. We apologize for any inconvenience.   Follow-Up: At [facility], you and your health needs are our priority.  As part of our continuing mission to provide you with exceptional heart care, our providers are all part of one team.  This team includes your primary Cardiologist  (physician) and Advanced Practice Providers or APPs (Physician Assistants and Nurse Practitioners) who all work together to provide you with the care you need, when you need it.  Your next appointment:   2 year(s)  Provider:   Eilleen Grates, MD    We recommend signing up for the patient portal called "MyChart".  Sign up information is provided on this After Visit Summary.  MyChart is used to connect with patients for Virtual Visits (Telemedicine).  Patients are able to view lab/test results, encounter notes, upcoming appointments, etc.  Non-urgent messages can be sent to your provider as well.   To learn more about what you can do with MyChart, go to [email or web address].   Other Instructions Thank you for choosing [facility]!

## 2023-07-04 ENCOUNTER — Other Ambulatory Visit: Payer: Self-pay

## 2023-07-04 ENCOUNTER — Other Ambulatory Visit: Payer: Self-pay | Admitting: Cardiology

## 2023-07-08 ENCOUNTER — Ambulatory Visit: Attending: Cardiology

## 2023-07-08 DIAGNOSIS — I341 Nonrheumatic mitral (valve) prolapse: Secondary | ICD-10-CM | POA: Diagnosis not present

## 2023-07-09 ENCOUNTER — Other Ambulatory Visit: Payer: Self-pay | Admitting: Family Medicine

## 2023-07-09 ENCOUNTER — Encounter: Payer: Self-pay | Admitting: Family Medicine

## 2023-07-09 LAB — ECHOCARDIOGRAM COMPLETE
AR max vel: 2.12 cm2
AV Area VTI: 2.14 cm2
AV Area mean vel: 2.16 cm2
AV Mean grad: 4 mmHg
AV Peak grad: 8.4 mmHg
Ao pk vel: 1.45 m/s
Calc EF: 73.9 %
S' Lateral: 2.1 cm
Single Plane A2C EF: 78.3 %
Single Plane A4C EF: 68.4 %

## 2023-07-09 NOTE — Telephone Encounter (Signed)
 Stacks NTBS Last OV 05/09/22 NO RF sent to pharmacy last OV greater than a year

## 2023-07-09 NOTE — Telephone Encounter (Signed)
 Letter mailed

## 2023-07-11 ENCOUNTER — Other Ambulatory Visit: Payer: Self-pay | Admitting: Family Medicine

## 2023-07-11 ENCOUNTER — Encounter: Payer: Self-pay | Admitting: Family Medicine

## 2023-07-11 NOTE — Telephone Encounter (Signed)
 Stacks NTBS last OV 05/09/22 NO RF sent to pharmacy last OV greater than a year

## 2023-07-11 NOTE — Telephone Encounter (Signed)
 Letter mailed

## 2023-07-13 ENCOUNTER — Ambulatory Visit: Payer: Self-pay | Admitting: Cardiology

## 2023-07-16 ENCOUNTER — Other Ambulatory Visit: Payer: Self-pay | Admitting: Family Medicine

## 2023-07-16 MED ORDER — DULOXETINE HCL 30 MG PO CPEP: 60.0000 mg | ORAL_CAPSULE | Freq: Every day | ORAL | 0 refills | Status: DC

## 2023-07-16 NOTE — Telephone Encounter (Signed)
 Copied from CRM [id number]. Topic: Clinical - Medication Refill >> Jul 16, 2023  9:57 AM Tobias L wrote: Medication: DULoxetine  (CYMBALTA ) 30 MG capsule Patient requesting courtesy refill, patient scheduled appointment with Dr. Zollie for 07/24/23.   Has the patient contacted their pharmacy? Yes Told to contact office and make appointment for further refills.  This is the patient's preferred pharmacy:  [facility] [address], [place] - [address] JEANETT HAMMERSMITH [address] [place] [address] Phone: [phone] Fax: [phone]  Is this the correct pharmacy for this prescription? Yes  Has the prescription been filled recently? No  Is the patient out of the medication? Yes  Has the patient been seen for an appointment in the last year OR does the patient have an upcoming appointment? Yes  Can we respond through MyChart? No  Agent: Please be advised that Rx refills may take up to 3 business days. We ask that you follow-up with your pharmacy.

## 2023-07-24 ENCOUNTER — Ambulatory Visit: Admitting: Family Medicine

## 2023-07-24 ENCOUNTER — Encounter: Payer: Self-pay | Admitting: Family Medicine

## 2023-07-24 VITALS — BP 129/76 | HR 76 | Temp 98.1°F | Ht 62.0 in | Wt 152.0 lb

## 2023-07-24 DIAGNOSIS — F339 Major depressive disorder, recurrent, unspecified: Secondary | ICD-10-CM | POA: Diagnosis not present

## 2023-07-24 DIAGNOSIS — E039 Hypothyroidism, unspecified: Secondary | ICD-10-CM

## 2023-07-24 DIAGNOSIS — K2100 Gastro-esophageal reflux disease with esophagitis, without bleeding: Secondary | ICD-10-CM

## 2023-07-24 MED ORDER — DULOXETINE HCL 30 MG PO CPEP: 60.0000 mg | ORAL_CAPSULE | Freq: Every day | ORAL | 0 refills | Status: DC

## 2023-07-24 MED ORDER — PANTOPRAZOLE SODIUM 40 MG PO TBEC: 40.0000 mg | DELAYED_RELEASE_TABLET | Freq: Every day | ORAL | 3 refills | Status: AC

## 2023-07-24 MED ORDER — THYROID 60 MG PO TABS: 60.0000 mg | ORAL_TABLET | Freq: Every day | ORAL | 0 refills | Status: DC

## 2023-07-24 NOTE — Progress Notes (Addendum)
 Subjective:  Patient ID: Sandra Powell, female    DOB: 23-Feb-1970  Age: 53 y.o. MRN: [id number]  CC: Medication Refill (pended)   HPI Sandra Powell presents for  follow-up on  thyroid . The patient has a history of hypothyroidism for many years. It has been stable recently. Pt. denies any change in  voice, loss of hair, heat or cold intolerance. Energy level has been adequate to good. Patient denies constipation and diarrhea. No myxedema. Medication is as noted below. Verified that pt is taking it daily on an empty stomach. Well tolerated.      07/24/2023    1:47 PM 05/09/2022    3:48 PM 03/28/2022    3:25 PM  Depression screen PHQ 2/9  Decreased Interest 0 0 0  Down, Depressed, Hopeless 0 0 0  PHQ - 2 Score 0 0 0  Altered sleeping 0    Tired, decreased energy 0    Change in appetite 0    Feeling bad or failure about yourself  0    Trouble concentrating 0    Moving slowly or fidgety/restless 0    Suicidal thoughts 0    PHQ-9 Score 0    Difficult doing work/chores Not difficult at all      History [name] has a past medical history of Allergy, Anemia, Anxiety, Arthritis, Depression, Diverticulitis, Diverticulosis, GERD (gastroesophageal reflux disease), Hypertension, MVP (mitral valve prolapse), Syncope, Syncope and collapse (01/26/2020), and Thyroid  disease.   She has a past surgical history that includes Tonsillectomy; Induced abortion (2000); and Endometrial ablation.   Her family history includes Heart disease in her sister; Heart failure (age of onset: 30) in an other family member; Heart failure (age of onset: 10) in her sister; Mitral valve prolapse in her sister; Syncope episode in her father and sister; Uterine cancer in her mother.She reports that she has never smoked. She has been exposed to tobacco smoke. She has never used smokeless tobacco. She reports that she does not currently use alcohol. She reports current drug use. Frequency: 7.00 times per week. Drug:  Marijuana.    ROS Review of Systems  Constitutional: Negative.   HENT: Negative.    Eyes:  Negative for visual disturbance.  Respiratory:  Negative for shortness of breath.   Cardiovascular:  Negative for chest pain.  Gastrointestinal:  Positive for abdominal pain.  Musculoskeletal:  Negative for arthralgias.    Objective:  BP 129/76   Pulse 76   Temp 98.1 F (36.7 C)   Ht 5' 2 (1.575 m)   Wt 152 lb (68.9 kg)   SpO2 97%   BMI 27.80 kg/m   BP Readings from Last 3 Encounters:  07/24/23 129/76  06/05/23 132/86  05/23/22 120/84    Wt Readings from Last 3 Encounters:  07/24/23 152 lb (68.9 kg)  06/05/23 149 lb (67.6 kg)  05/23/22 141 lb (64 kg)     Physical Exam Constitutional:      General: She is not in acute distress.    Appearance: She is well-developed.  HENT:     Head: Normocephalic and atraumatic.  Eyes:     Conjunctiva/sclera: Conjunctivae normal.     Pupils: Pupils are equal, round, and reactive to light.  Neck:     Thyroid : No thyromegaly.  Cardiovascular:     Rate and Rhythm: Normal rate and regular rhythm.     Heart sounds: Normal heart sounds. No murmur heard. Pulmonary:     Effort: Pulmonary effort is normal. No respiratory distress.  Breath sounds: Normal breath sounds. No wheezing or rales.  Abdominal:     General: Bowel sounds are normal. There is no distension.     Palpations: Abdomen is soft.     Tenderness: There is no abdominal tenderness.  Musculoskeletal:        General: Normal range of motion.     Cervical back: Normal range of motion and neck supple.  Lymphadenopathy:     Cervical: No cervical adenopathy.  Skin:    General: Skin is warm and dry.  Neurological:     Mental Status: She is alert and oriented to person, place, and time.  Psychiatric:        Behavior: Behavior normal.        Thought Content: Thought content normal.        Judgment: Judgment normal.      Assessment & Plan:  Gastroesophageal reflux disease  with esophagitis without hemorrhage -     CBC with Differential/Platelet  Hypothyroidism, unspecified type -     TSH + free T4 -     CBC with Differential/Platelet -     CMP14+EGFR  Depression, recurrent (HCC) -     TSH + free T4  Other orders -     DULoxetine  HCl; Take 2 capsules (60 mg total) by mouth daily. With a full stomach at suppertime  Dispense: 60 capsule; Refill: 0 -     Thyroid ; Take 1 tablet (60 mg total) by mouth daily before breakfast.  Dispense: 90 tablet; Refill: 0 -     Pantoprazole  Sodium; Take 1 tablet (40 mg total) by mouth daily. For stomach  Dispense: 90 tablet; Refill: 3     Follow-up: Return in about 1 year (around 07/23/2024) for [facility] physical.  Butler Der, M.D.

## 2023-07-25 ENCOUNTER — Ambulatory Visit: Payer: Self-pay | Admitting: Family Medicine

## 2023-07-25 LAB — CBC WITH DIFFERENTIAL/PLATELET
Basophils Absolute: 0 x10E3/uL (ref 0.0–0.2)
Basos: 1 %
EOS (ABSOLUTE): 0.2 x10E3/uL (ref 0.0–0.4)
Eos: 2 %
Hematocrit: 44.7 % (ref 34.0–46.6)
Hemoglobin: 14.7 g/dL (ref 11.1–15.9)
Immature Grans (Abs): 0 x10E3/uL (ref 0.0–0.1)
Immature Granulocytes: 0 %
Lymphocytes Absolute: 2.6 x10E3/uL (ref 0.7–3.1)
Lymphs: 34 %
MCH: 30.6 pg (ref 26.6–33.0)
MCHC: 32.9 g/dL (ref 31.5–35.7)
MCV: 93 fL (ref 79–97)
Monocytes Absolute: 0.7 x10E3/uL (ref 0.1–0.9)
Monocytes: 9 %
Neutrophils Absolute: 4.1 x10E3/uL (ref 1.4–7.0)
Neutrophils: 54 %
Platelets: 260 x10E3/uL (ref 150–450)
RBC: 4.81 x10E6/uL (ref 3.77–5.28)
RDW: 12.3 % (ref 11.7–15.4)
WBC: 7.6 x10E3/uL (ref 3.4–10.8)

## 2023-07-25 LAB — CMP14+EGFR
ALT: 29 IU/L (ref 0–32)
AST: 23 IU/L (ref 0–40)
Albumin: 4.5 g/dL (ref 3.8–4.9)
Alkaline Phosphatase: 97 IU/L (ref 44–121)
BUN/Creatinine Ratio: 21 (ref 9–23)
BUN: 14 mg/dL (ref 6–24)
Bilirubin Total: 0.3 mg/dL (ref 0.0–1.2)
CO2: 25 mmol/L (ref 20–29)
Calcium: 10.2 mg/dL (ref 8.7–10.2)
Chloride: 100 mmol/L (ref 96–106)
Creatinine, Ser: 0.68 mg/dL (ref 0.57–1.00)
Globulin, Total: 2.2 g/dL (ref 1.5–4.5)
Glucose: 75 mg/dL (ref 70–99)
Potassium: 4.6 mmol/L (ref 3.5–5.2)
Sodium: 140 mmol/L (ref 134–144)
Total Protein: 6.7 g/dL (ref 6.0–8.5)
eGFR: 104 mL/min/1.73 (ref >59)

## 2023-07-25 LAB — TSH+FREE T4
Free T4: 0.64 ng/dL — AB (ref 0.82–1.77)
TSH: 2.6 u[IU]/mL (ref 0.450–4.500)

## 2023-07-25 NOTE — Progress Notes (Signed)
Hello Marcelline,  Your lab result is normal and/or stable.Some minor variations that are not significant are commonly marked abnormal, but do not represent any medical problem for you.  Best regards, Kdyn Vonbehren, M.D.

## 2023-08-12 NOTE — Telephone Encounter (Signed)
 Patient is returning call.

## 2023-09-07 ENCOUNTER — Other Ambulatory Visit: Payer: Self-pay | Admitting: Family Medicine

## 2023-09-13 ENCOUNTER — Other Ambulatory Visit: Payer: Self-pay | Admitting: Family Medicine

## 2023-09-13 DIAGNOSIS — Z1231 Encounter for screening mammogram for malignant neoplasm of breast: Secondary | ICD-10-CM

## 2023-09-25 ENCOUNTER — Ambulatory Visit
Admission: RE | Admit: 2023-09-25 | Discharge: 2023-09-25 | Disposition: A | Discharge status: Home / Self Care | Source: Ambulatory Visit | Attending: Family Medicine | Admitting: Family Medicine

## 2023-09-25 DIAGNOSIS — Z1231 Encounter for screening mammogram for malignant neoplasm of breast: Secondary | ICD-10-CM

## 2023-10-23 ENCOUNTER — Other Ambulatory Visit: Payer: Self-pay | Admitting: Family Medicine
